# Patient Record
Sex: Female | Born: 1955 | Hispanic: Yes | Marital: Married | Smoking: Never smoker
Health system: Southern US, Community
[De-identification: ages and names within clinical notes are randomized; demographics above are authoritative.]

## PROBLEM LIST (undated history)

## (undated) DIAGNOSIS — M2021 Hallux rigidus, right foot: Secondary | ICD-10-CM

## (undated) DIAGNOSIS — I10 Essential (primary) hypertension: Secondary | ICD-10-CM

## (undated) DIAGNOSIS — N946 Dysmenorrhea, unspecified: Secondary | ICD-10-CM

## (undated) DIAGNOSIS — R05 Cough: Secondary | ICD-10-CM

## (undated) DIAGNOSIS — E785 Hyperlipidemia, unspecified: Secondary | ICD-10-CM

## (undated) DIAGNOSIS — R002 Palpitations: Secondary | ICD-10-CM

## (undated) DIAGNOSIS — I209 Angina pectoris, unspecified: Secondary | ICD-10-CM

## (undated) DIAGNOSIS — M1990 Unspecified osteoarthritis, unspecified site: Secondary | ICD-10-CM

## (undated) DIAGNOSIS — R059 Cough, unspecified: Secondary | ICD-10-CM

## (undated) HISTORY — PX: COLONOSCOPY: SHX174

## (undated) HISTORY — PX: SQUAMOUS CELL CARCINOMA EXCISION: SHX2433

## (undated) HISTORY — PX: APPENDECTOMY: SHX54

## (undated) HISTORY — PX: TONGUE SURGERY: SHX810

## (undated) HISTORY — PX: NECK SURGERY: SHX720

## (undated) HISTORY — DX: Dysmenorrhea, unspecified: N94.6

## (undated) HISTORY — PX: ARTHRODESIS METATARSALPHALANGEAL JOINT (MTPJ): SHX6566

---

## 2006-03-29 ENCOUNTER — Ambulatory Visit: Payer: Self-pay

## 2007-08-05 ENCOUNTER — Ambulatory Visit: Payer: Self-pay

## 2007-11-07 ENCOUNTER — Ambulatory Visit: Payer: Self-pay | Admitting: Otolaryngology

## 2007-11-10 ENCOUNTER — Ambulatory Visit: Payer: Self-pay | Admitting: Otolaryngology

## 2007-11-12 ENCOUNTER — Ambulatory Visit: Payer: Self-pay | Admitting: Otolaryngology

## 2007-11-19 ENCOUNTER — Inpatient Hospital Stay: Payer: Self-pay | Admitting: Otolaryngology

## 2008-03-24 ENCOUNTER — Encounter: Payer: Self-pay | Admitting: Otolaryngology

## 2008-04-05 ENCOUNTER — Encounter: Payer: Self-pay | Admitting: Otolaryngology

## 2008-08-17 ENCOUNTER — Ambulatory Visit: Payer: Self-pay

## 2008-08-19 ENCOUNTER — Ambulatory Visit: Payer: Self-pay

## 2008-08-31 ENCOUNTER — Ambulatory Visit: Payer: Self-pay | Admitting: Gastroenterology

## 2008-12-10 ENCOUNTER — Ambulatory Visit: Payer: Self-pay | Admitting: Otolaryngology

## 2009-02-21 ENCOUNTER — Ambulatory Visit: Payer: Self-pay

## 2009-08-22 ENCOUNTER — Ambulatory Visit: Payer: Self-pay

## 2009-12-28 ENCOUNTER — Ambulatory Visit: Payer: Self-pay

## 2010-03-03 ENCOUNTER — Ambulatory Visit: Payer: Self-pay | Admitting: Otolaryngology

## 2010-03-09 ENCOUNTER — Ambulatory Visit: Payer: Self-pay | Admitting: Otolaryngology

## 2010-07-19 ENCOUNTER — Ambulatory Visit: Payer: Self-pay | Admitting: Podiatry

## 2010-08-24 ENCOUNTER — Ambulatory Visit: Payer: Self-pay

## 2011-09-27 ENCOUNTER — Ambulatory Visit: Payer: Self-pay

## 2012-02-18 ENCOUNTER — Emergency Department: Payer: Self-pay | Admitting: Emergency Medicine

## 2012-02-18 LAB — CBC
HCT: 39.6 % (ref 35.0–47.0)
RBC: 4.72 10*6/uL (ref 3.80–5.20)
RDW: 13.3 % (ref 11.5–14.5)
WBC: 8.4 10*3/uL (ref 3.6–11.0)

## 2012-02-18 LAB — BASIC METABOLIC PANEL
BUN: 9 mg/dL (ref 7–18)
Chloride: 109 mmol/L — ABNORMAL HIGH (ref 98–107)
Co2: 25 mmol/L (ref 21–32)
Creatinine: 0.79 mg/dL (ref 0.60–1.30)
EGFR (Non-African Amer.): >60
Glucose: 146 mg/dL — ABNORMAL HIGH (ref 65–99)
Osmolality: 281 (ref 275–301)

## 2012-02-18 LAB — CK TOTAL AND CKMB (NOT AT ARMC)
CK, Total: 46 U/L (ref 21–215)
CK-MB: <0.5 ng/mL — ABNORMAL LOW (ref 0.5–3.6)

## 2012-02-18 LAB — TROPONIN I: Troponin-I: <0.02 ng/mL

## 2012-06-05 ENCOUNTER — Ambulatory Visit: Payer: Self-pay | Admitting: Internal Medicine

## 2012-06-05 HISTORY — PX: CARDIAC CATHETERIZATION: SHX172

## 2012-11-04 ENCOUNTER — Ambulatory Visit: Payer: Self-pay

## 2014-01-08 ENCOUNTER — Ambulatory Visit: Payer: Self-pay

## 2014-05-02 LAB — HM PAP SMEAR: HM PAP: NEGATIVE

## 2014-07-26 ENCOUNTER — Encounter: Payer: Self-pay | Admitting: Anesthesiology

## 2014-07-26 ENCOUNTER — Encounter: Payer: Self-pay | Admitting: *Deleted

## 2014-07-30 ENCOUNTER — Ambulatory Visit
Admission: RE | Admit: 2014-07-30 | Payer: Federal, State, Local not specified - PPO | Source: Ambulatory Visit | Admitting: Podiatry

## 2014-07-30 HISTORY — DX: Unspecified osteoarthritis, unspecified site: M19.90

## 2014-07-30 HISTORY — DX: Essential (primary) hypertension: I10

## 2014-07-30 SURGERY — FUSION, JOINT, GREAT TOE
Anesthesia: Regional | Laterality: Right

## 2014-08-10 ENCOUNTER — Encounter: Payer: Self-pay | Admitting: Anesthesiology

## 2014-08-10 NOTE — Discharge Instructions (Signed)
[facility] °[facility] ° °POST OPERATIVE INSTRUCTIONS FOR DR. TROXLER AND DR. FOWLER °[facility] ° ° °1. Take your medication as prescribed.  Pain medication should be taken only as needed. ° °2. Keep the dressing clean, dry and intact. ° °3. Keep your foot elevated above the heart level for the first 48 hours. ° °4. Walking to the bathroom and brief periods of walking are acceptable, unless we have instructed you to be non-weight bearing. ° °5. Always wear your post-op shoe when walking.  Always use your crutches if you are to be non-weight bearing. ° °6. Do not take a shower. Baths are permissible as long as the foot is kept out of the water.  ° °7. Every hour you are awake:  °- Bend your knee 15 times. °- Flex foot 15 times °- Massage calf 15 times ° °8. Call [facility] ([phone]) if any of the following problems occur: °- You develop a temperature or fever. °- The bandage becomes saturated with blood. °- Medication does not stop your pain. °- Injury of the foot occurs. °- Any symptoms of infection including redness, odor, or red streaks running from wound. °-  ° °General Anesthesia, Care After °Refer to this sheet in the next few weeks. These instructions provide you with information on caring for yourself after your procedure. Your health care provider may also give you more specific instructions. Your treatment has been planned according to current medical practices, but problems sometimes occur. Call your health care provider if you have any problems or questions after your procedure. °WHAT TO EXPECT AFTER THE PROCEDURE °After the procedure, it is typical to experience: °· Sleepiness. °· Nausea and vomiting. °HOME CARE INSTRUCTIONS °· For the first 24 hours after general anesthesia: °¨ Have a responsible person with you. °¨ Do not drive a car. If you are alone, do not take public transportation. °¨ Do not drink alcohol. °¨ Do not take medicine  that has not been prescribed by your health care provider. °¨ Do not sign important papers or make important decisions. °¨ You may resume a normal diet and activities as directed by your health care provider. °· Change bandages (dressings) as directed. °· If you have questions or problems that seem related to general anesthesia, call the hospital and ask for the anesthetist or anesthesiologist on call. °SEEK MEDICAL CARE IF: °· You have nausea and vomiting that continue the day after anesthesia. °· You develop a rash. °SEEK IMMEDIATE MEDICAL CARE IF:  °· You have difficulty breathing. °· You have chest pain. °· You have any allergic problems. °Document Released: 04/30/2000 Document Revised: 01/27/2013 Document Reviewed: 08/07/2012 °ExitCare® Patient Information ©2015 [employer]. This information is not intended to replace advice given to you by your health care provider. Make sure you discuss any questions you have with your health care provider. ° °

## 2014-08-11 ENCOUNTER — Ambulatory Visit
Admission: RE | Admit: 2014-08-11 | Payer: Federal, State, Local not specified - PPO | Source: Ambulatory Visit | Admitting: Podiatry

## 2014-08-11 HISTORY — DX: Angina pectoris, unspecified: I20.9

## 2014-08-11 HISTORY — DX: Palpitations: R00.2

## 2014-08-11 HISTORY — DX: Hyperlipidemia, unspecified: E78.5

## 2014-08-11 HISTORY — DX: Cough, unspecified: R05.9

## 2014-08-11 HISTORY — DX: Hallux rigidus, right foot: M20.21

## 2014-08-11 HISTORY — DX: Cough: R05

## 2014-08-11 SURGERY — FUSION, JOINT, GREAT TOE
Anesthesia: Regional | Laterality: Right

## 2015-01-19 ENCOUNTER — Ambulatory Visit: Admit: 2015-01-19 | Payer: Federal, State, Local not specified - PPO | Admitting: Podiatry

## 2015-01-19 SURGERY — FUSION, JOINT, GREAT TOE
Anesthesia: Regional | Laterality: Right

## 2015-04-18 ENCOUNTER — Other Ambulatory Visit: Payer: Self-pay | Admitting: Obstetrics and Gynecology

## 2015-04-18 DIAGNOSIS — Z1231 Encounter for screening mammogram for malignant neoplasm of breast: Secondary | ICD-10-CM

## 2015-04-27 ENCOUNTER — Ambulatory Visit
Admission: RE | Admit: 2015-04-27 | Discharge: 2015-04-27 | Disposition: A | Discharge status: Home / Self Care | Payer: Federal, State, Local not specified - PPO | Source: Ambulatory Visit | Attending: Obstetrics and Gynecology | Admitting: Obstetrics and Gynecology

## 2015-04-27 DIAGNOSIS — Z1231 Encounter for screening mammogram for malignant neoplasm of breast: Secondary | ICD-10-CM | POA: Diagnosis not present

## 2015-04-27 LAB — HM MAMMOGRAPHY

## 2015-07-13 ENCOUNTER — Ambulatory Visit: Admit: 2015-07-13 | Payer: Federal, State, Local not specified - PPO | Admitting: Podiatry

## 2015-07-13 SURGERY — FUSION, JOINT, GREAT TOE
Anesthesia: General | Laterality: Right

## 2016-03-12 ENCOUNTER — Other Ambulatory Visit: Payer: Self-pay | Admitting: Family Medicine

## 2016-03-12 ENCOUNTER — Other Ambulatory Visit: Payer: Self-pay | Admitting: Obstetrics and Gynecology

## 2016-03-12 DIAGNOSIS — Z1231 Encounter for screening mammogram for malignant neoplasm of breast: Secondary | ICD-10-CM

## 2016-04-29 NOTE — Progress Notes (Deleted)
    HPI:      [name] is a 61 y.o. No obstetric history on file. who LMP was No LMP recorded. Patient is postmenopausal., presents today for her annual examination.  Her menses are {norm/abn:715}, lasting {number:22536} days.  Dysmenorrhea {dysmen:716}. She {does:18564} have intermenstrual bleeding.  She {does:18564} have vasomotor sx. She uses ***meds.  Sex activity: {sex active:315163}. She {does:18564} have vaginal dryness.  Last Pap: 04/27/14. Results were: no abnormalities /neg HPV DNA.  Hx of STDs: {STD hx:14358}  Last mammogram: April 27, 2015  Results were: normal--routine follow-up in 12 months There is no FH of breast cancer. There is no FH of ovarian cancer. The patient {does:18564} do self-breast exams.  Colonoscopy: colonoscopy 8 years ago without abnormalities.   Tobacco use: {tob:20664} Alcohol use: {Alcohol:11675} Exercise: {exercise:31265}  She {does:18564} get adequate calcium and Vitamin D in her diet. ***  Past Medical History:  Diagnosis Date  . Anginal pain (HCC)    stable  . Arthritis    feet  . Cough    AND CHEST CONGESTION, ON ANTIBIOTICS  . Hallux rigidus of right foot   . Hyperlipidemia   . Hypertension    borderline  . Palpitations     Past Surgical History:  Procedure Laterality Date  . APPENDECTOMY    . ARTHRODESIS METATARSALPHALANGEAL JOINT (MTPJ) Left    first mtp joint  . CARDIAC CATHETERIZATION  06/05/12   [facility] - report in paper chart  . CESAREAN SECTION  1985, 1989  . COLONOSCOPY    . COLONOSCOPY    . NECK SURGERY Right    anterior  . SQUAMOUS CELL CARCINOMA EXCISION Right    glossal  . TONGUE SURGERY Right    glossal squamous cell carcinoma, smoker    No family history on file.   ROS:  ROS  Objective: There were no vitals taken for this visit.   OBGyn Exam  Results: No results found for this or any previous visit (from the past 24 hour(s)).  Assessment/Plan:  *** No orders of the defined types were  placed in this encounter.           GYN counsel {counseling:16159}     F/U  No Follow-up on file.  Diana B. Copland, PA-C 04/29/2016 2:39 PM

## 2016-04-30 ENCOUNTER — Ambulatory Visit: Payer: Self-pay | Admitting: Obstetrics and Gynecology

## 2016-05-04 ENCOUNTER — Ambulatory Visit: Payer: Federal, State, Local not specified - PPO

## 2016-06-13 ENCOUNTER — Ambulatory Visit: Payer: Self-pay | Admitting: Obstetrics and Gynecology

## 2016-07-06 ENCOUNTER — Other Ambulatory Visit: Payer: Self-pay | Admitting: Podiatry

## 2016-07-09 ENCOUNTER — Ambulatory Visit
Admission: RE | Admit: 2016-07-09 | Discharge: 2016-07-09 | Disposition: A | Discharge status: Home / Self Care | Payer: Federal, State, Local not specified - PPO | Source: Ambulatory Visit | Attending: Family Medicine | Admitting: Family Medicine

## 2016-07-09 DIAGNOSIS — Z1231 Encounter for screening mammogram for malignant neoplasm of breast: Secondary | ICD-10-CM | POA: Insufficient documentation

## 2016-07-10 NOTE — Discharge Instructions (Signed)
[facility] °[facility] ° °POST OPERATIVE INSTRUCTIONS FOR DR. TROXLER AND DR. FOWLER °[facility] ° ° °1. Take your medication as prescribed.  Pain medication should be taken only as needed. ° °2. Keep the dressing clean, dry and intact. ° °3. Keep your foot elevated above the heart level for the first 48 hours. ° °4. Walking to the bathroom and brief periods of walking are acceptable, unless we have instructed you to be non-weight bearing. ° °5. Always wear your post-op shoe when walking.  Always use your crutches if you are to be non-weight bearing. ° °6. Do not take a shower. Baths are permissible as long as the foot is kept out of the water.  ° °7. Every hour you are awake:  °- Bend your knee 15 times. °- Flex foot 15 times °- Massage calf 15 times ° °8. Call [facility] ([phone]) if any of the following problems occur: °- You develop a temperature or fever. °- The bandage becomes saturated with blood. °- Medication does not stop your pain. °- Injury of the foot occurs. °- Any symptoms of infection including redness, odor, or red streaks running from wound. ° ° °General Anesthesia, Adult, Care After °These instructions provide you with information about caring for yourself after your procedure. Your health care provider may also give you more specific instructions. Your treatment has been planned according to current medical practices, but problems sometimes occur. Call your health care provider if you have any problems or questions after your procedure. °What can I expect after the procedure? °After the procedure, it is common to have: °· Vomiting. °· A sore throat. °· Mental slowness. ° °It is common to feel: °· Nauseous. °· Cold or shivery. °· Sleepy. °· Tired. °· Sore or achy, even in parts of your body where you did not have surgery. ° °Follow these instructions at home: °For at least 24 hours after the procedure: °· Do not: °? Participate in  activities where you could fall or become injured. °? Drive. °? Use heavy machinery. °? Drink alcohol. °? Take sleeping pills or medicines that cause drowsiness. °? Make important decisions or sign legal documents. °? Take care of children on your own. °· Rest. °Eating and drinking °· If you vomit, drink water, juice, or soup when you can drink without vomiting. °· Drink enough fluid to keep your urine clear or pale yellow. °· Make sure you have little or no nausea before eating solid foods. °· Follow the diet recommended by your health care provider. °General instructions °· Have a responsible adult stay with you until you are awake and alert. °· Return to your normal activities as told by your health care provider. Ask your health care provider what activities are safe for you. °· Take over-the-counter and prescription medicines only as told by your health care provider. °· If you smoke, do not smoke without supervision. °· Keep all follow-up visits as told by your health care provider. This is important. °Contact a health care provider if: °· You continue to have nausea or vomiting at home, and medicines are not helpful. °· You cannot drink fluids or start eating again. °· You cannot urinate after 8-12 hours. °· You develop a skin rash. °· You have fever. °· You have increasing redness at the site of your procedure. °Get help right away if: °· You have difficulty breathing. °· You have chest pain. °· You have unexpected bleeding. °· You feel that you   are having a life-threatening or urgent problem. °This information is not intended to replace advice given to you by your health care provider. Make sure you discuss any questions you have with your health care provider. °Document Released: 04/30/2000 Document Revised: 06/27/2015 Document Reviewed: 01/06/2015 °Elsevier Interactive Patient Education © 2018 [employer]. ° °

## 2016-07-11 ENCOUNTER — Ambulatory Visit: Payer: Federal, State, Local not specified - PPO | Admitting: Anesthesiology

## 2016-07-11 ENCOUNTER — Ambulatory Visit
Admission: RE | Admit: 2016-07-11 | Discharge: 2016-07-11 | Disposition: A | Discharge status: Home / Self Care | Payer: Federal, State, Local not specified - PPO | Source: Ambulatory Visit | Attending: Podiatry | Admitting: Podiatry

## 2016-07-11 ENCOUNTER — Encounter: Payer: Self-pay | Admitting: *Deleted

## 2016-07-11 ENCOUNTER — Encounter
Admission: RE | Disposition: A | Discharge status: Home / Self Care | Payer: Self-pay | Source: Ambulatory Visit | Attending: Podiatry

## 2016-07-11 DIAGNOSIS — Z85810 Personal history of malignant neoplasm of tongue: Secondary | ICD-10-CM | POA: Insufficient documentation

## 2016-07-11 DIAGNOSIS — Z9049 Acquired absence of other specified parts of digestive tract: Secondary | ICD-10-CM | POA: Insufficient documentation

## 2016-07-11 DIAGNOSIS — Z825 Family history of asthma and other chronic lower respiratory diseases: Secondary | ICD-10-CM | POA: Insufficient documentation

## 2016-07-11 DIAGNOSIS — M2021 Hallux rigidus, right foot: Secondary | ICD-10-CM | POA: Insufficient documentation

## 2016-07-11 DIAGNOSIS — Z9889 Other specified postprocedural states: Secondary | ICD-10-CM | POA: Insufficient documentation

## 2016-07-11 DIAGNOSIS — Z7984 Long term (current) use of oral hypoglycemic drugs: Secondary | ICD-10-CM | POA: Insufficient documentation

## 2016-07-11 DIAGNOSIS — Z79899 Other long term (current) drug therapy: Secondary | ICD-10-CM | POA: Diagnosis not present

## 2016-07-11 DIAGNOSIS — E782 Mixed hyperlipidemia: Secondary | ICD-10-CM | POA: Insufficient documentation

## 2016-07-11 DIAGNOSIS — Z833 Family history of diabetes mellitus: Secondary | ICD-10-CM | POA: Diagnosis not present

## 2016-07-11 DIAGNOSIS — Z8249 Family history of ischemic heart disease and other diseases of the circulatory system: Secondary | ICD-10-CM | POA: Diagnosis not present

## 2016-07-11 DIAGNOSIS — Z7951 Long term (current) use of inhaled steroids: Secondary | ICD-10-CM | POA: Insufficient documentation

## 2016-07-11 DIAGNOSIS — I10 Essential (primary) hypertension: Secondary | ICD-10-CM | POA: Insufficient documentation

## 2016-07-11 DIAGNOSIS — R002 Palpitations: Secondary | ICD-10-CM | POA: Diagnosis not present

## 2016-07-11 DIAGNOSIS — I493 Ventricular premature depolarization: Secondary | ICD-10-CM | POA: Insufficient documentation

## 2016-07-11 HISTORY — PX: FOOT ARTHRODESIS: SHX1655

## 2016-07-11 SURGERY — FUSION, JOINT, FOOT
Anesthesia: Regional | Site: Foot | Laterality: Right | Wound class: Clean

## 2016-07-11 MED ORDER — BUPIVACAINE HCL (PF) 0.25 % IJ SOLN
INTRAMUSCULAR | Status: DC | PRN
  Administered 2016-07-11: 10 mL

## 2016-07-11 MED ORDER — HYDROCODONE-ACETAMINOPHEN 5-325 MG PO TABS: 1.0000 | ORAL_TABLET | ORAL | 0 refills | Status: DC | PRN

## 2016-07-11 MED ORDER — CEFAZOLIN SODIUM-DEXTROSE 2-4 GM/100ML-% IV SOLN
2.0000 g | INTRAVENOUS | Status: AC
  Administered 2016-07-11: 2 g via INTRAVENOUS

## 2016-07-11 MED ORDER — PROMETHAZINE HCL 25 MG/ML IJ SOLN: 6.2500 mg | INTRAMUSCULAR | Status: DC | PRN

## 2016-07-11 MED ORDER — FENTANYL CITRATE (PF) 100 MCG/2ML IJ SOLN
INTRAMUSCULAR | Status: DC | PRN
  Administered 2016-07-11: 100 ug via INTRAVENOUS

## 2016-07-11 MED ORDER — OXYCODONE HCL 5 MG/5ML PO SOLN: 5.0000 mg | Freq: Once | ORAL | Status: DC | PRN

## 2016-07-11 MED ORDER — FENTANYL CITRATE (PF) 100 MCG/2ML IJ SOLN: 25.0000 ug | INTRAMUSCULAR | Status: DC | PRN

## 2016-07-11 MED ORDER — ONDANSETRON HCL 4 MG/2ML IJ SOLN: 4.0000 mg | Freq: Four times a day (QID) | INTRAMUSCULAR | Status: DC | PRN

## 2016-07-11 MED ORDER — POVIDONE-IODINE 7.5 % EX SOLN
Freq: Once | CUTANEOUS | Status: AC
  Administered 2016-07-11: 12:00:00 via TOPICAL

## 2016-07-11 MED ORDER — ACETAMINOPHEN 325 MG PO TABS
325.0000 mg | ORAL_TABLET | ORAL | Status: DC | PRN
  Administered 2016-07-11: 650 mg via ORAL

## 2016-07-11 MED ORDER — HYDROCODONE-ACETAMINOPHEN 5-325 MG PO TABS: 1.0000 | ORAL_TABLET | ORAL | Status: DC | PRN

## 2016-07-11 MED ORDER — ONDANSETRON HCL 4 MG PO TABS: 4.0000 mg | ORAL_TABLET | Freq: Four times a day (QID) | ORAL | Status: DC | PRN

## 2016-07-11 MED ORDER — LIDOCAINE HCL (CARDIAC) 20 MG/ML IV SOLN
INTRAVENOUS | Status: DC | PRN
  Administered 2016-07-11: 40 mg via INTRATRACHEAL

## 2016-07-11 MED ORDER — ACETAMINOPHEN 160 MG/5ML PO SOLN: 325.0000 mg | ORAL | Status: DC | PRN

## 2016-07-11 MED ORDER — PROPOFOL 10 MG/ML IV BOLUS
INTRAVENOUS | Status: DC | PRN
  Administered 2016-07-11: 50 mg via INTRAVENOUS
  Administered 2016-07-11: 150 mg via INTRAVENOUS

## 2016-07-11 MED ORDER — ROPIVACAINE HCL 5 MG/ML IJ SOLN
INTRAMUSCULAR | Status: DC | PRN
  Administered 2016-07-11: 30 mL via PERINEURAL

## 2016-07-11 MED ORDER — LACTATED RINGERS IV SOLN
10.0000 mL/h | INTRAVENOUS | Status: DC
  Administered 2016-07-11: 10 mL/h via INTRAVENOUS
  Administered 2016-07-11: 14:00:00 via INTRAVENOUS

## 2016-07-11 MED ORDER — ONDANSETRON HCL 4 MG/2ML IJ SOLN
INTRAMUSCULAR | Status: DC | PRN
  Administered 2016-07-11: 4 mg via INTRAVENOUS

## 2016-07-11 MED ORDER — MIDAZOLAM HCL 2 MG/2ML IJ SOLN
INTRAMUSCULAR | Status: DC | PRN
  Administered 2016-07-11: 2 mg via INTRAVENOUS

## 2016-07-11 MED ORDER — GLYCOPYRROLATE 0.2 MG/ML IJ SOLN
INTRAMUSCULAR | Status: DC | PRN
  Administered 2016-07-11: 0.1 mg via INTRAVENOUS

## 2016-07-11 MED ORDER — PROMETHAZINE HCL 12.5 MG PO TABS: 12.5000 mg | ORAL_TABLET | Freq: Four times a day (QID) | ORAL | 0 refills | Status: DC | PRN

## 2016-07-11 MED ORDER — OXYCODONE HCL 5 MG PO TABS: 5.0000 mg | ORAL_TABLET | Freq: Once | ORAL | Status: DC | PRN

## 2016-07-11 MED ORDER — DEXAMETHASONE SODIUM PHOSPHATE 4 MG/ML IJ SOLN
INTRAMUSCULAR | Status: DC | PRN
  Administered 2016-07-11: 4 mg via INTRAVENOUS

## 2016-07-11 SURGICAL SUPPLY — 63 items
BANDAGE ELASTIC 4 VELCRO NS (GAUZE/BANDAGES/DRESSINGS) ×2 IMPLANT
BENZOIN TINCTURE PRP APPL 2/3 (GAUZE/BANDAGES/DRESSINGS) ×2 IMPLANT
BIT DRILL 2 FENESTRATED (MISCELLANEOUS) ×1 IMPLANT
BIT DRILL CANNULTD 2.6 X 130MM (MISCELLANEOUS) ×1 IMPLANT
BIT DRILL SOLID 2.0 X 110MM (DRILL) ×1 IMPLANT
BIT DRILLL 2 FENESTRATED (MISCELLANEOUS) ×1
BLADE MED AGGRESSIVE (BLADE) ×2 IMPLANT
BLADE OSC/SAGITTAL MD 5.5X18 (BLADE) IMPLANT
BLADE SURG 15 STRL LF DISP TIS (BLADE) ×1 IMPLANT
BLADE SURG 15 STRL SS (BLADE) ×1
BNDG COHESIVE 4X5 TAN STRL (GAUZE/BANDAGES/DRESSINGS) ×2 IMPLANT
BNDG ESMARK 4X12 TAN STRL LF (GAUZE/BANDAGES/DRESSINGS) ×2 IMPLANT
BNDG GAUZE 4.5X4.1 6PLY STRL (MISCELLANEOUS) ×2 IMPLANT
BNDG STRETCH 4X75 STRL LF (GAUZE/BANDAGES/DRESSINGS) ×2 IMPLANT
CANISTER SUCT 1200ML W/VALVE (MISCELLANEOUS) ×2 IMPLANT
COUNTERSICK 4.0 HEADED (MISCELLANEOUS) ×2
COVER LIGHT HANDLE UNIVERSAL (MISCELLANEOUS) ×4 IMPLANT
CUFF TOURN SGL QUICK 18 (TOURNIQUET CUFF) ×2 IMPLANT
DRAPE FLUOR MINI C-ARM 54X84 (DRAPES) ×2 IMPLANT
DRILL CANNULATED 2.6 X 130MM (MISCELLANEOUS) ×2
DRILL SOLID 2.0 X 110MM (DRILL) ×2
DURAPREP 26ML APPLICATOR (WOUND CARE) ×2 IMPLANT
FEMALE REAMER 23MM ×2 IMPLANT
GAUZE PETRO XEROFOAM 1X8 (MISCELLANEOUS) ×2 IMPLANT
GAUZE SPONGE 4X4 12PLY STRL (GAUZE/BANDAGES/DRESSINGS) ×2 IMPLANT
GLOVE BIO SURGEON STRL SZ7.5 (GLOVE) ×4 IMPLANT
GLOVE INDICATOR 8.0 STRL GRN (GLOVE) ×4 IMPLANT
GOWN STRL REUS W/ TWL LRG LVL3 (GOWN DISPOSABLE) ×2 IMPLANT
GOWN STRL REUS W/TWL LRG LVL3 (GOWN DISPOSABLE) ×2
K-WIRE DBL END TROCAR 6X.045 (WIRE)
K-WIRE DBL END TROCAR 6X.062 (WIRE)
K-WIRE SMOOTH 1.6X150MM (WIRE) ×2
K-WIRE SNGL END 1.2X150 (MISCELLANEOUS) ×2
KIT ROOM TURNOVER OR (KITS) ×2 IMPLANT
KWIRE DBL END TROCAR 6X.045 (WIRE) IMPLANT
KWIRE DBL END TROCAR 6X.062 (WIRE) IMPLANT
KWIRE SMOOTH 1.6X150MM (WIRE) ×1 IMPLANT
KWIRE SNGL END 1.2X150 (MISCELLANEOUS) ×1 IMPLANT
MALE REAMER 23 MM ×2 IMPLANT
NON-LOCKING PLATE SCREW 3.5 X 16MM (Screw) ×2 IMPLANT
NS IRRIG 500ML POUR BTL (IV SOLUTION) ×2 IMPLANT
PACK EXTREMITY ARMC (MISCELLANEOUS) ×2 IMPLANT
PAD GROUND ADULT SPLIT (MISCELLANEOUS) ×2 IMPLANT
PIN BALLS 3/8 F/.045 WIRE (MISCELLANEOUS) ×2 IMPLANT
PLATE MTP 0DEG RIGHT (Plate) ×2 IMPLANT
RASP SM TEAR CROSS CUT (RASP) ×2 IMPLANT
SCREW 4.0X24 ST (Screw) ×2 IMPLANT
SCREW COUNTERSINK 4.0 HEADED (MISCELLANEOUS) ×1 IMPLANT
SCREW LOCK PLATE R3 2.7X12 (Screw) ×2 IMPLANT
SCREW LOCK PLATE R3 2.7X18 (Screw) ×6 IMPLANT
SCREW LOCK PLATE R3 2.7X22 (Screw) ×2 IMPLANT
SCREW NON LOCKING PLATE 2.7X14 (Screw) ×2 IMPLANT
STOCKINETTE IMPERVIOUS LG (DRAPES) ×2 IMPLANT
STRAP BODY AND KNEE 60X3 (MISCELLANEOUS) ×2 IMPLANT
STRIP CLOSURE SKIN 1/4X4 (GAUZE/BANDAGES/DRESSINGS) ×2 IMPLANT
SUT MNCRL 5-0+ PC-1 (SUTURE) ×1 IMPLANT
SUT MONOCRYL 5-0 (SUTURE) ×1
SUT VIC AB 2-0 SH 27 (SUTURE)
SUT VIC AB 2-0 SH 27XBRD (SUTURE) IMPLANT
SUT VIC AB 3-0 SH 27 (SUTURE) ×1
SUT VIC AB 3-0 SH 27X BRD (SUTURE) ×1 IMPLANT
SUT VIC AB 4-0 FS2 27 (SUTURE) ×2 IMPLANT
WIRE OLIVE SMOOTH 1.4MMX60MM (WIRE) ×4 IMPLANT

## 2016-07-11 NOTE — H&P (Signed)
HISTORY AND PHYSICAL INTERVAL NOTE:  07/11/2016  12:30 PM  Diana Booth  has presented today for surgery, with the diagnosis of Hallux regidus of right foot  M20.21.  The various methods of treatment have been discussed with the patient.  No guarantees were given.  After consideration of risks, benefits and other options for treatment, the patient has consented to surgery.  I have reviewed the patients' chart and labs.    Patient Vitals for the past 24 hrs:  BP Temp Pulse Resp SpO2 Height Weight  07/11/16 1209 - - (!) 52 10 100 % - -  07/11/16 1208 - - 62 14 100 % - -  07/11/16 1207 - - (!) 53 12 100 % - -  07/11/16 1206 - - (!) 56 18 100 % - -  07/11/16 1205 (!) 159/72 - (!) 51 15 100 % - -  07/11/16 1204 - - (!) 57 13 100 % - -  07/11/16 1203 - - 60 18 100 % - -  07/11/16 1202 - - (!) 52 14 100 % - -  07/11/16 1201 (!) 147/65 - (!) 53 16 100 % - -  07/11/16 1200 - - (!) 59 15 100 % - -  07/11/16 1159 - - (!) 59 16 100 % - -  07/11/16 1156 (!) 148/69 - - - - - -  07/11/16 1153 - - (!) 57 17 100 % - -  07/11/16 1152 - - (!) 57 19 100 % - -  07/11/16 1151 - - 61 18 100 % - -  07/11/16 1135 (!) 166/81 - (!) 56 12 100 % - -  07/11/16 1130 (!) 162/75 - 67 19 100 % - -  07/11/16 1107 (!) 158/78 98.1 F (36.7 C) 68 16 100 % 5\' 4"  (1.626 m) 81.2 kg (179 lb)    A history and physical examination was performed in my office.  The patient was reexamined.  There have been no changes to this history and physical examination.  Gwyneth RevelsFowler, Nora Sabey A

## 2016-07-11 NOTE — Op Note (Signed)
Operative note   Surgeon:Rapheal Masso Armed forces logistics/support/administrative officerowler    Assistant: None    Preop diagnosis: Right first MTPJ hallux rigidus    Postop diagnosis: Same    Procedure: Right first MTPJ arthrodesis    EBL: Minimal    Anesthesia:regional and general    Hemostasis: Midcalf tourniquet inflated to 200 mmHg for 94 minutes    Specimen: None    Complications: None    Operative indications:Diana Gwenlyn PerkingMadera is an 61 y.o. that presents today for surgical intervention.  The risks/benefits/alternatives/complications have been discussed and consent has been given.    Procedure:  Patient was brought into the OR and placed on the operating table in thesupine position. After anesthesia was obtained theright lower extremity was prepped and draped in usual sterile fashion.  Attention was directed to the dorsal medial first MTPJ where a longitudinal incision was performed. Sharp and blunt dissection carried down to the capsule. Longitudinal capsulotomy was then performed. The head of the metatarsal and base of the proximal phalanx were exposed. All articular cartilage and periarticular spurring was removed at this time. Next using a cup and cone reamer the head of the metatarsal and base of the proximal phalanx were then prepared for arthrodesis. Good removal of the bone through the subchondral bone plate was noted. Areas were then drilled with a 2 mm punch drill bit. The toe was held in a neutral position approximately 5 from the plantar surface. A 4.0 mm compression screw was driven from distal medial to proximal lateral. A dorsal medium sized locking plate with a 0 bend from the paragon28 set was then fashioned dorsally. Good alignment and compression was noted with good stability. All wounds were flushed with copious amounts of irrigation. Closure was performed with a 3-0 Vicryl for the subcutaneous and capsular tissue. Vicryl for the subcutaneous tissue and a 5-0 Monocryl undyed for skin. 0.25% Marcaine was introduced  along all areas.    Patient tolerated the procedure and anesthesia well.  Was transported from the OR to the PACU with all vital signs stable and vascular status intact. To be discharged per routine protocol.  Will follow up in approximately 1 week in the outpatient clinic.

## 2016-07-11 NOTE — Transfer of Care (Signed)
Immediate Anesthesia Transfer of Care Note  Patient: Lorrin MaisMiriam Berne  Procedure(s) Performed: Procedure(s): ARTHRODESIS FOOT-1ST MTPJ FUSION-RIGHT (Right)  Patient Location: PACU  Anesthesia Type: Regional, General  Level of Consciousness: awake, alert  and patient cooperative  Airway and Oxygen Therapy: Patient Spontanous Breathing and Patient connected to supplemental oxygen  Post-op Assessment: Post-op Vital signs reviewed, Patient's Cardiovascular Status Stable, Respiratory Function Stable, Patent Airway and No signs of Nausea or vomiting  Post-op Vital Signs: Reviewed and stable  Complications: No apparent anesthesia complications

## 2016-07-11 NOTE — Anesthesia Procedure Notes (Signed)
Procedure Name: LMA Insertion Date/Time: 07/11/2016 12:48 PM Performed by: Jimmy PicketAMYOT, Sherolyn Trettin Pre-anesthesia Checklist: Patient identified, Emergency Drugs available, Suction available, Timeout performed and Patient being monitored Patient Re-evaluated:Patient Re-evaluated prior to inductionOxygen Delivery Method: Circle system utilized Preoxygenation: Pre-oxygenation with 100% oxygen Intubation Type: IV induction LMA: LMA inserted LMA Size: 4.0 Number of attempts: 1 Placement Confirmation: positive ETCO2 and breath sounds checked- equal and bilateral Tube secured with: Tape

## 2016-07-11 NOTE — Anesthesia Preprocedure Evaluation (Signed)
Anesthesia Evaluation  Patient identified by MRN, date of birth, ID band Patient awake    Reviewed: Allergy & Precautions, NPO status , Patient's Chart, lab work & pertinent test results  Airway Mallampati: II  TM Distance: >3 FB     Dental  (+) Teeth Intact   Pulmonary    breath sounds clear to auscultation       Cardiovascular hypertension (borderline, no medications),  Rhythm:Regular Rate:Normal  hyperlipidemia   Neuro/Psych    GI/Hepatic   Endo/Other    Renal/GU      Musculoskeletal  (+) Arthritis ,   Abdominal   Peds  Hematology   Anesthesia Other Findings   Reproductive/Obstetrics                            Anesthesia Physical Anesthesia Plan  ASA: II  Anesthesia Plan: Regional and General   Post-op Pain Management:  Regional for Post-op pain   Induction:   PONV Risk Score and Plan: 3 and Ondansetron, Dexamethasone and Treatment may vary due to age  Airway Management Planned:   Additional Equipment:   Intra-op Plan:   Post-operative Plan:   Informed Consent: I have reviewed the patients History and Physical, chart, labs and discussed the procedure including the risks, benefits and alternatives for the proposed anesthesia with the patient or authorized representative who has indicated his/her understanding and acceptance.   Dental advisory given  Plan Discussed with: CRNA  Anesthesia Plan Comments:         Anesthesia Quick Evaluation

## 2016-07-11 NOTE — Progress Notes (Signed)
Assisted Dr.Elsje with right, ultrasound guided, popliteal block. Side rails up, monitors on throughout procedure. See vital signs in flow sheet. Tolerated Procedure well.

## 2016-07-11 NOTE — Anesthesia Procedure Notes (Signed)
Anesthesia Regional Block: Popliteal block   Pre-Anesthetic Checklist: ,, timeout performed, Correct Patient, Correct Site, Correct Laterality, Correct Procedure, Correct Position, risks and benefits discussed, surgical consent, pre-op evaluation,  At surgeon's request and post-op pain management  Laterality: Right  Prep: chloraprep       Needles:  Injection technique: Single-shot  Needle Type: Stimiplex     Needle Length: 10cm  Needle Gauge: 21     Additional Needles:   Procedures: ultrasound guided,,,,,,,,  Narrative:  Start time: 07/11/2016 11:32 AM End time: 07/11/2016 11:38 AM Injection made incrementally with aspirations every 5 mL.  Performed by: Personally  Anesthesiologist: Jola BabinskiHARKER, Kerryn Tennant  Additional Notes: No immediate complications.

## 2016-07-11 NOTE — Anesthesia Postprocedure Evaluation (Signed)
Anesthesia Post Note  Patient: Diana Booth  Procedure(s) Performed: Procedure(s) (LRB): ARTHRODESIS FOOT-1ST MTPJ FUSION-RIGHT (Right)  Patient location during evaluation: PACU Anesthesia Type: Regional and General Level of consciousness: awake and alert Pain management: pain level controlled Vital Signs Assessment: post-procedure vital signs reviewed and stable Respiratory status: spontaneous breathing, nonlabored ventilation and respiratory function stable Cardiovascular status: stable Postop Assessment: no signs of nausea or vomiting Anesthetic complications: no    Jola BabinskiElsje Estle Sabella

## 2016-07-12 ENCOUNTER — Encounter: Payer: Self-pay | Admitting: Podiatry

## 2016-07-18 ENCOUNTER — Ambulatory Visit: Payer: Self-pay | Admitting: Obstetrics and Gynecology

## 2016-12-17 ENCOUNTER — Ambulatory Visit: Payer: Self-pay | Admitting: Obstetrics and Gynecology

## 2016-12-26 ENCOUNTER — Ambulatory Visit (INDEPENDENT_AMBULATORY_CARE_PROVIDER_SITE_OTHER): Payer: Federal, State, Local not specified - PPO | Admitting: Obstetrics and Gynecology

## 2016-12-26 ENCOUNTER — Encounter: Payer: Self-pay | Admitting: Obstetrics and Gynecology

## 2016-12-26 VITALS — BP 138/80 | HR 68 | Ht 64.0 in | Wt 183.0 lb

## 2016-12-26 DIAGNOSIS — Z1231 Encounter for screening mammogram for malignant neoplasm of breast: Secondary | ICD-10-CM | POA: Diagnosis not present

## 2016-12-26 DIAGNOSIS — Z1239 Encounter for other screening for malignant neoplasm of breast: Secondary | ICD-10-CM

## 2016-12-26 DIAGNOSIS — Z01419 Encounter for gynecological examination (general) (routine) without abnormal findings: Secondary | ICD-10-CM | POA: Diagnosis not present

## 2016-12-26 NOTE — Progress Notes (Signed)
PCP: System, Pcp Not In   Chief Complaint  Patient presents with  . Gynecologic Exam    HPI:      [name] is a 61 y.o. [id number] who LMP was No LMP recorded. Patient is postmenopausal., presents today for her annual examination.  Her menses are absent due to menopause.  She does not have intermenstrual bleeding.  She does have tolerable vasomotor sx.  Sex activity: single partner, contraception - post menopausal status. She does have vaginal dryness. Has not tried lubricants.  Last Pap: April 27, 2014  Results were: no abnormalities /neg HPV DNA.  Hx of STDs: none  Last mammogram: July 09, 2016  Results were: normal--routine follow-up in 12 months There is a FH of breast cancer in her pat cousin, genetic testing not indicated for pt. There is no FH of ovarian cancer. The patient does do self-breast exams.  Colonoscopy: colonoscopy 8 years ago without abnormalities. Repeat due after 10 years.   Tobacco use: The patient denies current or previous tobacco use. Alcohol use: none Exercise: moderately active  She does get adequate calcium and Vitamin D in her diet.  Labs with PCP.   Past Medical History:  Diagnosis Date  . Anginal pain (HCC)    stable  . Arthritis    feet  . Cough    AND CHEST CONGESTION, ON ANTIBIOTICS  . Dysmenorrhea   . Hallux rigidus of right foot   . Hyperlipidemia   . Hypertension    borderline  . Palpitations     Past Surgical History:  Procedure Laterality Date  . APPENDECTOMY    . ARTHRODESIS METATARSALPHALANGEAL JOINT (MTPJ) Left    first mtp joint  . CARDIAC CATHETERIZATION  06/05/12   [facility] - report in paper chart  . CESAREAN SECTION  1985, 1989  . COLONOSCOPY    . COLONOSCOPY    . FOOT ARTHRODESIS Right 07/11/2016   Procedure: ARTHRODESIS FOOT-1ST MTPJ FUSION-RIGHT;  Surgeon: Gwyneth RevelsFowler, Justin, DPM;  [facility];  Service: Podiatry;  Laterality: Right;  . NECK SURGERY Right    anterior  . SQUAMOUS CELL CARCINOMA  EXCISION Right    glossal  . TONGUE SURGERY Right    glossal squamous cell carcinoma, smoker    Family History  Problem Relation Age of Onset  . Breast cancer Neg Hx     Social History   Socioeconomic History  . Marital status: Married    Spouse name: Not on file  . Number of children: Not on file  . Years of education: Not on file  . Highest education level: Not on file  Social Needs  . Financial resource strain: Not on file  . Food insecurity - worry: Not on file  . Food insecurity - inability: Not on file  . Transportation needs - medical: Not on file  . Transportation needs - non-medical: Not on file  Occupational History  . Not on file  Tobacco Use  . Smoking status: Never Smoker  . Smokeless tobacco: Never Used  Substance and Sexual Activity  . Alcohol use: Yes    Comment: "glass of wine every couple of weeks"  . Drug use: No  . Sexual activity: Not on file  Other Topics Concern  . Not on file  Social History Narrative  . Not on file    No outpatient medications have been marked as taking for the 12/26/16 encounter (Office Visit) with Copland, Ilona SorrelAlicia B, PA-C.      ROS:  Review  of Systems  Constitutional: Negative for fatigue, fever and unexpected weight change.  Respiratory: Negative for cough, shortness of breath and wheezing.   Cardiovascular: Negative for chest pain, palpitations and leg swelling.  Gastrointestinal: Negative for blood in stool, constipation, diarrhea, nausea and vomiting.  Endocrine: Negative for cold intolerance, heat intolerance and polyuria.  Genitourinary: Negative for dyspareunia, dysuria, flank pain, frequency, genital sores, hematuria, menstrual problem, pelvic pain, urgency, vaginal bleeding, vaginal discharge and vaginal pain.  Musculoskeletal: Negative for back pain, joint swelling and myalgias.  Skin: Negative for rash.  Neurological: Negative for dizziness, syncope, light-headedness, numbness and headaches.    Hematological: Negative for adenopathy.  Psychiatric/Behavioral: Negative for agitation, confusion, sleep disturbance and suicidal ideas. The patient is not nervous/anxious.      Objective: BP 138/80   Pulse 68   Ht 5\' 4"  (1.626 m)   Wt 183 lb (83 kg)   BMI 31.41 kg/m    Physical Exam  Constitutional: She is oriented to person, place, and time. She appears well-developed and well-nourished.  Genitourinary: Vagina normal and uterus normal. There is no rash or tenderness on the right labia. There is no rash or tenderness on the left labia. No erythema or tenderness in the vagina. No vaginal discharge found. Right adnexum does not display mass and does not display tenderness. Left adnexum does not display mass and does not display tenderness. Cervix does not exhibit motion tenderness or polyp. Uterus is not enlarged or tender.  Neck: Normal range of motion. No thyromegaly present.  Cardiovascular: Normal rate, regular rhythm and normal heart sounds.  No murmur heard. Pulmonary/Chest: Effort normal and breath sounds normal. Right breast exhibits no mass, no nipple discharge, no skin change and no tenderness. Left breast exhibits no mass, no nipple discharge, no skin change and no tenderness.  Abdominal: Soft. There is no tenderness. There is no guarding.  Musculoskeletal: Normal range of motion.  Neurological: She is alert and oriented to person, place, and time. No cranial nerve deficit.  Psychiatric: She has a normal mood and affect. Her behavior is normal.  Vitals reviewed.   Assessment/Plan:  Encounter for annual routine gynecological examination  Screening for breast cancer - Mammo due 6/19. - Plan: MM DIGITAL SCREENING BILATERAL        GYN counsel mammography screening, menopause, adequate intake of calcium and vitamin D, diet and exercise    F/U  Return in about 1 year (around 12/26/2017).  Alicia B. Copland, PA-C 12/26/2016 8:57 AM

## 2016-12-26 NOTE — Patient Instructions (Signed)
I value your feedback and entrusting us with your care. If you get a [facility] patient survey, I would appreciate you taking the time to let us know about your experience today. Thank you! 

## 2017-07-16 ENCOUNTER — Encounter: Payer: Self-pay | Admitting: Obstetrics and Gynecology

## 2017-07-16 ENCOUNTER — Ambulatory Visit
Admission: RE | Admit: 2017-07-16 | Discharge: 2017-07-16 | Disposition: A | Discharge status: Home / Self Care | Payer: Federal, State, Local not specified - PPO | Source: Ambulatory Visit | Attending: Obstetrics and Gynecology | Admitting: Obstetrics and Gynecology

## 2017-07-16 DIAGNOSIS — Z1231 Encounter for screening mammogram for malignant neoplasm of breast: Secondary | ICD-10-CM | POA: Diagnosis present

## 2017-07-16 DIAGNOSIS — Z1239 Encounter for other screening for malignant neoplasm of breast: Secondary | ICD-10-CM

## 2018-01-20 ENCOUNTER — Ambulatory Visit (INDEPENDENT_AMBULATORY_CARE_PROVIDER_SITE_OTHER): Payer: Federal, State, Local not specified - PPO | Admitting: Obstetrics and Gynecology

## 2018-01-20 ENCOUNTER — Encounter: Payer: Self-pay | Admitting: Obstetrics and Gynecology

## 2018-01-20 ENCOUNTER — Other Ambulatory Visit (HOSPITAL_COMMUNITY)
Admission: RE | Admit: 2018-01-20 | Discharge: 2018-01-20 | Disposition: A | Discharge status: Home / Self Care | Payer: Federal, State, Local not specified - PPO | Source: Ambulatory Visit | Attending: Obstetrics and Gynecology | Admitting: Obstetrics and Gynecology

## 2018-01-20 VITALS — BP 144/86 | HR 74 | Ht 64.0 in | Wt 181.0 lb

## 2018-01-20 DIAGNOSIS — N898 Other specified noninflammatory disorders of vagina: Secondary | ICD-10-CM | POA: Diagnosis not present

## 2018-01-20 DIAGNOSIS — Z1151 Encounter for screening for human papillomavirus (HPV): Secondary | ICD-10-CM | POA: Diagnosis present

## 2018-01-20 DIAGNOSIS — Z124 Encounter for screening for malignant neoplasm of cervix: Secondary | ICD-10-CM

## 2018-01-20 DIAGNOSIS — Z01419 Encounter for gynecological examination (general) (routine) without abnormal findings: Secondary | ICD-10-CM

## 2018-01-20 DIAGNOSIS — Z1239 Encounter for other screening for malignant neoplasm of breast: Secondary | ICD-10-CM

## 2018-01-20 LAB — POCT WET PREP WITH KOH
CLUE CELLS WET PREP PER HPF POC: NEGATIVE
KOH Prep POC: NEGATIVE
Trichomonas, UA: NEGATIVE
YEAST WET PREP PER HPF POC: NEGATIVE

## 2018-01-20 MED ORDER — CLOTRIMAZOLE-BETAMETHASONE 1-0.05 % EX CREA: TOPICAL_CREAM | CUTANEOUS | 0 refills | Status: DC

## 2018-01-20 NOTE — Progress Notes (Signed)
PCP: Jerl Mina, MD   Chief Complaint  Patient presents with  . Gynecologic Exam    pt is having vaginal itchiness and little odor, no discharge, just finished antibiotics about 3 weeks ago    HPI:      [name] is a 62 y.o. [id number] who LMP was No LMP recorded. Patient is postmenopausal., presents today for her annual examination.  Her menses are absent due to menopause.  She does not have intermenstrual bleeding. She does have tolerable vasomotor sx.   Sex activity: single partner, contraception - post menopausal status. She does have vaginal dryness.  She complains of vaginal irritation, no d/c/fishy odor for the past few wks. Sx started after abx use for sinusitis. Pt treated with monistat-1 twice with min relief. Last treated 2 wks ago. Uses dove soap and dryer sheets.   Last Pap: April 27, 2014  Results were: no abnormalities /neg HPV DNA.  Hx of STDs: none  Last mammogram: 07/16/17 Results were: normal--routine follow-up in 12 months There is a FH of breast cancer in her pat cousin, genetic testing not indicated for pt. There is no FH of ovarian cancer. The patient does do self-breast exams.  Colonoscopy: colonoscopy 9 years ago without abnormalities. Repeat due after 10 years.   Tobacco use: The patient denies current or previous tobacco use. Alcohol use: none Exercise: moderately active  She does get adequate calcium and Vitamin D in her diet.  Labs with PCP.   Past Medical History:  Diagnosis Date  . Anginal pain (HCC)    stable  . Arthritis    feet  . Cough    AND CHEST CONGESTION, ON ANTIBIOTICS  . Dysmenorrhea   . Hallux rigidus of right foot   . Hyperlipidemia   . Hypertension    borderline  . Palpitations     Past Surgical History:  Procedure Laterality Date  . APPENDECTOMY    . ARTHRODESIS METATARSALPHALANGEAL JOINT (MTPJ) Left    first mtp joint  . CARDIAC CATHETERIZATION  06/05/12   [facility] - report in paper chart  . CESAREAN SECTION   1985, 1989  . COLONOSCOPY    . COLONOSCOPY    . FOOT ARTHRODESIS Right 07/11/2016   Procedure: ARTHRODESIS FOOT-1ST MTPJ FUSION-RIGHT;  Surgeon: Gwyneth Revels, DPM;  Location: [facility];  Service: Podiatry;  Laterality: Right;  . NECK SURGERY Right    anterior  . SQUAMOUS CELL CARCINOMA EXCISION Right    glossal  . TONGUE SURGERY Right    glossal squamous cell carcinoma, smoker    Family History  Problem Relation Age of Onset  . Hypertension Mother   . Hypertension Father   . Diabetes Sister 12       Type 1  . Hypertension Sister   . Diabetes Brother        Type 2  . Diabetes Sister        Type 2  . Breast cancer Neg Hx     Social History   Socioeconomic History  . Marital status: Married    Spouse name: Not on file  . Number of children: Not on file  . Years of education: Not on file  . Highest education level: Not on file  Occupational History  . Not on file  Social Needs  . Financial resource strain: Not on file  . Food insecurity:    Worry: Not on file    Inability: Not on file  . Transportation needs:  Medical: Not on file    Non-medical: Not on file  Tobacco Use  . Smoking status: Never Smoker  . Smokeless tobacco: Never Used  Substance and Sexual Activity  . Alcohol use: Yes    Comment: "glass of wine every couple of weeks"  . Drug use: No  . Sexual activity: Yes    Birth control/protection: Post-menopausal  Lifestyle  . Physical activity:    Days per week: Not on file    Minutes per session: Not on file  . Stress: Not on file  Relationships  . Social connections:    Talks on phone: Not on file    Gets together: Not on file    Attends religious service: Not on file    Active member of club or organization: Not on file    Attends meetings of clubs or organizations: Not on file    Relationship status: Not on file  . Intimate partner violence:    Fear of current or ex partner: Not on file    Emotionally abused: Not on file     Physically abused: Not on file    Forced sexual activity: Not on file  Other Topics Concern  . Not on file  Social History Narrative  . Not on file    Current Meds  Medication Sig  . benzonatate (TESSALON) 100 MG capsule Take by mouth 3 (three) times daily as needed for cough (as needed for coughing).  . Calcium Carbonate 1500 (600 CA) MG TABS Take by mouth daily.  . Cholecalciferol ([employer] VIT D 5000 HIGH-POTENCY PO) Take by mouth daily.  Marland Kitchen glipiZIDE (GLUCOTROL XL) 2.5 MG 24 hr tablet TAKE ONE TABLET BY MOUTH EVERY DAY  . glucose blood (ACCU-CHEK COMPACT PLUS) test strip Use 2 (two) times daily  . ipratropium (ATROVENT) 0.03 % nasal spray Place into the nose.  Marland Kitchen Specialty Vitamins Products (MENOPAUSE SUPPORT PO) Take by mouth daily.      ROS:  Review of Systems  Constitutional: Negative for fatigue, fever and unexpected weight change.  Respiratory: Positive for cough. Negative for shortness of breath and wheezing.   Cardiovascular: Negative for chest pain, palpitations and leg swelling.  Gastrointestinal: Negative for blood in stool, constipation, diarrhea, nausea and vomiting.  Endocrine: Negative for cold intolerance, heat intolerance and polyuria.  Genitourinary: Positive for dyspareunia and dysuria. Negative for flank pain, frequency, genital sores, hematuria, menstrual problem, pelvic pain, urgency, vaginal bleeding, vaginal discharge and vaginal pain.  Musculoskeletal: Negative for back pain, joint swelling and myalgias.  Skin: Negative for rash.  Neurological: Negative for dizziness, syncope, light-headedness, numbness and headaches.  Hematological: Negative for adenopathy.  Psychiatric/Behavioral: Negative for agitation, confusion, sleep disturbance and suicidal ideas. The patient is not nervous/anxious.      Objective: BP (!) 144/86   Pulse 74   Ht 5\' 4"  (1.626 m)   Wt 181 lb (82.1 kg)   BMI 31.07 kg/m    Physical Exam Constitutional:      Appearance: She is  well-developed.  Genitourinary:     Vagina, cervix, uterus, right adnexa and left adnexa normal.     No vaginal discharge, erythema or tenderness.     No cervical polyp.     Uterus is not enlarged or tender.     No right or left adnexal mass present.     Right adnexa not tender.     Left adnexa not tender.     Genitourinary Comments: ERYTHEMA AT INTROITUS  Neck:     Musculoskeletal:  Normal range of motion.     Thyroid: No thyromegaly.  Cardiovascular:     Rate and Rhythm: Normal rate and regular rhythm.     Heart sounds: Normal heart sounds. No murmur.  Pulmonary:     Effort: Pulmonary effort is normal.     Breath sounds: Normal breath sounds.  Chest:     Breasts:        Right: No mass, nipple discharge, skin change or tenderness.        Left: No mass, nipple discharge, skin change or tenderness.  Abdominal:     Palpations: Abdomen is soft.     Tenderness: There is no abdominal tenderness. There is no guarding.  Musculoskeletal: Normal range of motion.  Neurological:     Mental Status: She is alert and oriented to person, place, and time.     Cranial Nerves: No cranial nerve deficit.  Psychiatric:        Behavior: Behavior normal.  Vitals signs reviewed.   RESULTS:  Results for orders placed or performed in visit on 01/20/18 (from the past 24 hour(s))  POCT Wet Prep with KOH     Status: Normal   Collection Time: 01/20/18  4:24 PM  Result Value Ref Range   Trichomonas, UA Negative    Clue Cells Wet Prep HPF POC neg    Epithelial Wet Prep HPF POC     Yeast Wet Prep HPF POC neg    Bacteria Wet Prep HPF POC     RBC Wet Prep HPF POC     WBC Wet Prep HPF POC     KOH Prep POC Negative Negative     Assessment/Plan:  Encounter for annual routine gynecological examination  Cervical cancer screening - Plan: Cytology - PAP  Screening for HPV (human papillomavirus) - Plan: Cytology - PAP  Screening for breast cancer - Pt current on mammo - Plan: MM 3D SCREEN BREAST  BILATERAL  Vaginal itching - Pos ext exam/neg wet prep. Rx lotrisone crm. Line dry underwear. F/u prn.  - Plan: POCT Wet Prep with KOH, clotrimazole-betamethasone (LOTRISONE) cream        GYN counsel mammography screening, menopause, adequate intake of calcium and vitamin D, diet and exercise    F/U  Return in about 1 year (around 01/21/2019).   B. , PA-C 01/20/2018 4:27 PM

## 2018-01-20 NOTE — Patient Instructions (Signed)
I value your feedback and entrusting us with your care. If you get a [place] patient survey, I would appreciate you taking the time to let us know about your experience today. Thank you! 

## 2018-01-23 LAB — CYTOLOGY - PAP
Diagnosis: NEGATIVE
HPV (WINDOPATH): NOT DETECTED

## 2018-02-26 ENCOUNTER — Telehealth: Payer: Self-pay

## 2018-02-26 NOTE — Telephone Encounter (Signed)
Pt says she is having a little pain in pelvic area, wondering if it would be in any way related to the yeast infection she had? She used the cream for 3 weeks, no discharge/odor/irritation, maybe a little itchiness at night but that's it. Please advise.

## 2018-02-26 NOTE — Telephone Encounter (Signed)
Pelvic pain is not related to yeast vag. Does she have any UTI sx or constipation/diarrhea symptoms with pain?

## 2018-02-26 NOTE — Telephone Encounter (Signed)
Pt states the cream worked a little; still has same problem.  [phone]

## 2018-02-26 NOTE — Telephone Encounter (Signed)
Called and left vm to call back, trying to get more info on symptoms going on now.

## 2018-02-27 NOTE — Telephone Encounter (Signed)
Pt says she is cramping at times, burns a little when urinates and that there is frequency, no other UTI symptoms and no constipation/diarrhea.

## 2018-02-27 NOTE — Telephone Encounter (Signed)
Called and LVMTRC. 

## 2018-02-27 NOTE — Telephone Encounter (Signed)
Pt should get checked for UTI. Can do with PCP or here.

## 2018-02-27 NOTE — Telephone Encounter (Signed)
Transferred to Sara to schedule appt. 

## 2018-03-04 ENCOUNTER — Ambulatory Visit: Payer: Federal, State, Local not specified - PPO | Admitting: Obstetrics and Gynecology

## 2018-03-17 ENCOUNTER — Ambulatory Visit (INDEPENDENT_AMBULATORY_CARE_PROVIDER_SITE_OTHER): Payer: Federal, State, Local not specified - PPO | Admitting: Obstetrics and Gynecology

## 2018-03-17 ENCOUNTER — Encounter: Payer: Self-pay | Admitting: Obstetrics and Gynecology

## 2018-03-17 VITALS — BP 128/80 | HR 70 | Ht 64.0 in | Wt 175.0 lb

## 2018-03-17 DIAGNOSIS — N761 Subacute and chronic vaginitis: Secondary | ICD-10-CM

## 2018-03-17 LAB — POCT WET PREP WITH KOH
CLUE CELLS WET PREP PER HPF POC: NEGATIVE
KOH PREP POC: NEGATIVE
TRICHOMONAS UA: NEGATIVE
YEAST WET PREP PER HPF POC: NEGATIVE

## 2018-03-17 MED ORDER — FLUCONAZOLE 150 MG PO TABS: 150.0000 mg | ORAL_TABLET | Freq: Once | ORAL | 0 refills | Status: AC

## 2018-03-17 NOTE — Addendum Note (Signed)
Addended by: Althea Grimmer B on: 03/17/2018 05:00 PM   Modules accepted: Orders

## 2018-03-17 NOTE — Patient Instructions (Signed)
I value your feedback and entrusting us with your care. If you get a [place] patient survey, I would appreciate you taking the time to let us know about your experience today. Thank you! 

## 2018-03-17 NOTE — Progress Notes (Addendum)
Diana MinaHedrick, [name], MD   Chief Complaint  Patient presents with  . Vaginitis    burning/itching sensation, little discharge and odor    HPI:      [name] is a 63 y.o. [id number] who LMP was No LMP recorded. Patient is postmenopausal., presents today for continued vaginal irritation/itch/burning, no increased d/c or odor. Sx started 12/19 after several rounds abx. Had neg wet prep/pos exam 01/20/18 and treated with lotrisone crm for 2 wks with No sx relief. Pt is using water to clean, line drying underwear, but had continued to use wet wipes vaginally to clean. Pt stopped them a wk ago and sx are a little improved now. Has noticed a fissure vaginally.    Past Medical History:  Diagnosis Date  . Anginal pain (HCC)    stable  . Arthritis    feet  . Cough    AND CHEST CONGESTION, ON ANTIBIOTICS  . Dysmenorrhea   . Hallux rigidus of right foot   . Hyperlipidemia   . Hypertension    borderline  . Palpitations     Past Surgical History:  Procedure Laterality Date  . APPENDECTOMY    . ARTHRODESIS METATARSALPHALANGEAL JOINT (MTPJ) Left    first mtp joint  . CARDIAC CATHETERIZATION  06/05/12   [facility] - report in paper chart  . CESAREAN SECTION  1985, 1989  . COLONOSCOPY    . COLONOSCOPY    . FOOT ARTHRODESIS Right 07/11/2016   Procedure: ARTHRODESIS FOOT-1ST MTPJ FUSION-RIGHT;  Surgeon: Gwyneth RevelsFowler, Justin, DPM;  [facility];  Service: Podiatry;  Laterality: Right;  . NECK SURGERY Right    anterior  . SQUAMOUS CELL CARCINOMA EXCISION Right    glossal  . TONGUE SURGERY Right    glossal squamous cell carcinoma, smoker    Family History  Problem Relation Age of Onset  . Hypertension Mother   . Hypertension Father   . Diabetes Sister 12       Type 1  . Hypertension Sister   . Diabetes Brother        Type 2  . Diabetes Sister        Type 2  . Breast cancer Neg Hx     Social History   Socioeconomic History  . Marital status: Married    Spouse name:  Not on file  . Number of children: Not on file  . Years of education: Not on file  . Highest education level: Not on file  Occupational History  . Not on file  Social Needs  . Financial resource strain: Not on file  . Food insecurity:    Worry: Not on file    Inability: Not on file  . Transportation needs:    Medical: Not on file    Non-medical: Not on file  Tobacco Use  . Smoking status: Never Smoker  . Smokeless tobacco: Never Used  Substance and Sexual Activity  . Alcohol use: Yes    Comment: "glass of wine every couple of weeks"  . Drug use: No  . Sexual activity: Yes    Birth control/protection: Post-menopausal  Lifestyle  . Physical activity:    Days per week: Not on file    Minutes per session: Not on file  . Stress: Not on file  Relationships  . Social connections:    Talks on phone: Not on file    Gets together: Not on file    Attends religious service: Not on file    Active  member of club or organization: Not on file    Attends meetings of clubs or organizations: Not on file    Relationship status: Not on file  . Intimate partner violence:    Fear of current or ex partner: Not on file    Emotionally abused: Not on file    Physically abused: Not on file    Forced sexual activity: Not on file  Other Topics Concern  . Not on file  Social History Narrative  . Not on file    Outpatient Medications Prior to Visit  Medication Sig Dispense Refill  . benzonatate (TESSALON) 100 MG capsule Take by mouth 3 (three) times daily as needed for cough (as needed for coughing).    . Calcium Carbonate 1500 (600 CA) MG TABS Take by mouth daily.    . Cholecalciferol ([employer]S VIT D 5000 HIGH-POTENCY PO) Take by mouth daily.    . clotrimazole-betamethasone (LOTRISONE) cream Apply externally BID prn sx up to 2 wks 15 g 0  . glipiZIDE (GLUCOTROL XL) 2.5 MG 24 hr tablet TAKE ONE TABLET BY MOUTH EVERY DAY    . glucose blood (ACCU-CHEK COMPACT PLUS) test strip Use 2 (two) times daily     . ipratropium (ATROVENT) 0.03 % nasal spray Place into the nose.    Marland Kitchen. Specialty Vitamins Products (MENOPAUSE SUPPORT PO) Take by mouth daily.     No facility-administered medications prior to visit.       ROS:  Review of Systems  Constitutional: Negative for fever.  Gastrointestinal: Negative for blood in stool, constipation, diarrhea, nausea and vomiting.  Genitourinary: Positive for vaginal discharge and vaginal pain. Negative for dyspareunia, dysuria, flank pain, frequency, hematuria, urgency and vaginal bleeding.  Musculoskeletal: Negative for back pain.  Skin: Negative for rash.   BREAST: No symptoms   OBJECTIVE:   Vitals:  BP 128/80   Pulse 70   Ht 5\' 4"  (1.626 m)   Wt 175 lb (79.4 kg)   BMI 30.04 kg/m   Physical Exam Vitals signs reviewed.  Constitutional:      Appearance: She is well-developed.  Pulmonary:     Effort: Pulmonary effort is normal.  Genitourinary:    Pubic Area: No rash.      Labia:        Right: Rash and tenderness present. No lesion.        Left: Rash and tenderness present. No lesion.      Vagina: Normal. No vaginal discharge, erythema or tenderness.     Cervix: Normal.     Uterus: Normal. Not enlarged and not tender.      Adnexa: Right adnexa normal and left adnexa normal.       Right: No mass or tenderness.         Left: No mass or tenderness.       Comments: EXTENSIVE IRRITATION/ERYTHEMA BILAT LABIA MAJORA/MINORA TO PERINEAL AREA; NO LESIONS; FISSURE AT SUPERIOR ASPECT OF BILAT LABIA MAJORA Musculoskeletal: Normal range of motion.  Neurological:     Mental Status: She is alert and oriented to person, place, and time.  Psychiatric:        Behavior: Behavior normal.        Thought Content: Thought content normal.     Results: Results for orders placed or performed in visit on 03/17/18 (from the past 24 hour(s))  POCT Wet Prep with KOH     Status: Normal   Collection Time: 03/17/18  4:32 PM  Result Value Ref Range   Trichomonas,  UA Negative    Clue Cells Wet Prep HPF POC neg    Epithelial Wet Prep HPF POC     Yeast Wet Prep HPF POC neg    Bacteria Wet Prep HPF POC     RBC Wet Prep HPF POC     WBC Wet Prep HPF POC     KOH Prep POC Negative Negative     Assessment/Plan: Subacute vaginitis - POS ext exam, neg wet prep. No relief with lotrisone crm. Rx diflucan x 2 doses. Check ONe Swab AV and yeast. Will f/u. Cold compresses/sitz baths - Plan: fluconazole (DIFLUCAN) 150 MG tablet, POCT Wet Prep with KOH, Other/Misc lab test    Meds ordered this encounter  Medications  . fluconazole (DIFLUCAN) 150 MG tablet    Sig: Take 1 tablet (150 mg total) by mouth once for 1 dose. Repeat after 3 days    Dispense:  2 tablet    Refill:  0    Order Specific Question:   Supervising Provider    Answer:   Nadara Mustard [[id number]]      Return if symptoms worsen or fail to improve.  Meya Clutter B. Nickcole Bralley, PA-C 03/17/2018 5:00 PM

## 2018-03-24 ENCOUNTER — Telehealth: Payer: Self-pay | Admitting: Obstetrics and Gynecology

## 2018-03-24 DIAGNOSIS — N761 Subacute and chronic vaginitis: Secondary | ICD-10-CM

## 2018-03-24 MED ORDER — MOXIFLOXACIN HCL 400 MG PO TABS: 400.0000 mg | ORAL_TABLET | Freq: Every day | ORAL | 0 refills | Status: AC

## 2018-03-24 NOTE — Telephone Encounter (Signed)
LM with One Swab culture results showing candida albicans and Entero faecalis. Pt already on diflucan x 2 doses. Rx avelox eRxd given sx. F/u prn.

## 2018-08-21 ENCOUNTER — Ambulatory Visit
Admission: RE | Admit: 2018-08-21 | Discharge: 2018-08-21 | Disposition: A | Discharge status: Home / Self Care | Payer: Federal, State, Local not specified - PPO | Source: Ambulatory Visit | Attending: Obstetrics and Gynecology | Admitting: Obstetrics and Gynecology

## 2018-08-21 ENCOUNTER — Other Ambulatory Visit: Payer: Self-pay

## 2018-08-21 ENCOUNTER — Encounter: Payer: Self-pay | Admitting: Obstetrics and Gynecology

## 2018-08-21 DIAGNOSIS — Z1231 Encounter for screening mammogram for malignant neoplasm of breast: Secondary | ICD-10-CM | POA: Diagnosis not present

## 2018-08-21 DIAGNOSIS — Z1239 Encounter for other screening for malignant neoplasm of breast: Secondary | ICD-10-CM

## 2019-01-25 NOTE — Progress Notes (Signed)
PCP: Maryland Pink, MD   Chief Complaint  Patient presents with  . Gynecologic Exam  . Pelvic Pain    for the past week on/off, no uti sx    HPI:      [name] is a 63 y.o. G2P2002 who LMP was No LMP recorded. Patient is postmenopausal., presents today for her annual examination.  Her menses are absent due to menopause.  She does not have intermenstrual bleeding. She does have tolerable vasomotor sx.   Sex activity: single partner, contraception - post menopausal status. She does have vaginal dryness. Having occas dyspareunia with achy, intermittent pain for past wk. No other aggrav factors other than sex. No vag, urin, GI sx. Has BM BID, normal for pt.   Last Pap:   Results were:01/20/18 no abnormalities /neg HPV DNA.  Hx of STDs: none  Last mammogram: 08/21/18  Results were: normal--routine follow-up in 12 months There is a FH of breast cancer in her pat cousin, genetic testing not indicated for pt. There is no FH of ovarian cancer. The patient does do self-breast exams.  Colonoscopy: colonoscopy 9/20 without abnormalities. Repeat due after 10 years.   Tobacco use: The patient denies current or previous tobacco use. Alcohol use: none  No drug use. Exercise: moderately active  She does get adequate calcium and Vitamin D in her diet.  Labs with PCP.   Past Medical History:  Diagnosis Date  . Anginal pain (HCC)    stable  . Arthritis    feet  . Cough    AND CHEST CONGESTION, ON ANTIBIOTICS  . Dysmenorrhea   . Hallux rigidus of right foot   . Hyperlipidemia   . Hypertension    borderline  . Palpitations     Past Surgical History:  Procedure Laterality Date  . APPENDECTOMY    . ARTHRODESIS METATARSALPHALANGEAL JOINT (MTPJ) Left    first mtp joint  . CARDIAC CATHETERIZATION  06/05/12   [facility] - report in paper chart  . [place]  . COLONOSCOPY    . COLONOSCOPY    . FOOT ARTHRODESIS Right 07/11/2016   Procedure: ARTHRODESIS FOOT-1ST MTPJ  FUSION-RIGHT;  Surgeon: Samara Deist, DPM;  Location: [place];  Service: Podiatry;  Laterality: Right;  . NECK SURGERY Right    anterior  . SQUAMOUS CELL CARCINOMA EXCISION Right    glossal  . TONGUE SURGERY Right    glossal squamous cell carcinoma, smoker    Family History  Problem Relation Age of Onset  . Hypertension Mother   . Hypertension Father   . Diabetes Sister 12       Type 1  . Hypertension Sister   . Diabetes Brother        Type 2  . Diabetes Sister        Type 2  . Breast cancer Neg Hx     Social History   Socioeconomic History  . Marital status: Married    Spouse name: Not on file  . Number of children: Not on file  . Years of education: Not on file  . Highest education level: Not on file  Occupational History  . Not on file  Tobacco Use  . Smoking status: Never Smoker  . Smokeless tobacco: Never Used  Substance and Sexual Activity  . Alcohol use: Yes    Comment: "glass of wine every couple of weeks"  . Drug use: No  . Sexual activity: Yes    Birth control/protection: Post-menopausal  Other Topics Concern  . Not on file  Social History Narrative  . Not on file   Social Determinants of Health   Financial Resource Strain:   . Difficulty of Paying Living Expenses: Not on file  Food Insecurity:   . Worried About Programme researcher, broadcasting/film/videounning Out of Food in the Last Year: Not on file  . Ran Out of Food in the Last Year: Not on file  Transportation Needs:   . Lack of Transportation (Medical): Not on file  . Lack of Transportation (Non-Medical): Not on file  Physical Activity:   . Days of Exercise per Week: Not on file  . Minutes of Exercise per Session: Not on file  Stress:   . Feeling of Stress : Not on file  Social Connections:   . Frequency of Communication with Friends and Family: Not on file  . Frequency of Social Gatherings with Friends and Family: Not on file  . Attends Religious Services: Not on file  . Active Member of Clubs or Organizations:  Not on file  . Attends BankerClub or Organization Meetings: Not on file  . Marital Status: Not on file  Intimate Partner Violence:   . Fear of Current or Ex-Partner: Not on file  . Emotionally Abused: Not on file  . Physically Abused: Not on file  . Sexually Abused: Not on file    Current Meds  Medication Sig  . benzonatate (TESSALON) 100 MG capsule Take by mouth 3 (three) times daily as needed for cough (as needed for coughing).  . Calcium Carbonate 1500 (600 CA) MG TABS Take by mouth daily.  . Cholecalciferol (CVS VIT D 5000 HIGH-POTENCY PO) Take by mouth daily.  . clotrimazole-betamethasone (LOTRISONE) cream Apply externally BID prn sx up to 2 wks  . GLIPIZIDE XL 5 MG 24 hr tablet Take 5 mg by mouth daily.  Marland Kitchen. glucose blood (PRECISION QID TEST) test strip Use 1 each (1 strip total) 2 (two) times daily  . Specialty Vitamins Products (MENOPAUSE SUPPORT PO) Take by mouth daily.      ROS:  Review of Systems  Constitutional: Negative for fatigue, fever and unexpected weight change.  Respiratory: Negative for cough, shortness of breath and wheezing.   Cardiovascular: Negative for chest pain, palpitations and leg swelling.  Gastrointestinal: Negative for blood in stool, constipation, diarrhea, nausea and vomiting.  Endocrine: Negative for cold intolerance, heat intolerance and polyuria.  Genitourinary: Positive for dyspareunia and pelvic pain. Negative for dysuria, flank pain, frequency, genital sores, hematuria, menstrual problem, urgency, vaginal bleeding, vaginal discharge and vaginal pain.  Musculoskeletal: Negative for back pain, joint swelling and myalgias.  Skin: Negative for rash.  Neurological: Negative for dizziness, syncope, light-headedness, numbness and headaches.  Hematological: Negative for adenopathy.  Psychiatric/Behavioral: Negative for agitation, confusion, sleep disturbance and suicidal ideas. The patient is not nervous/anxious.      Objective: BP 130/80   Ht 5\' 4"   (1.626 m)   Wt 180 lb (81.6 kg)   BMI 30.90 kg/m    Physical Exam Constitutional:      Appearance: She is well-developed.  Genitourinary:     Vulva, vagina, cervix, uterus, right adnexa and left adnexa normal.     No vulval lesion or tenderness noted.     No vaginal discharge, erythema or tenderness.     No cervical polyp.     Uterus is not enlarged or tender.     No right or left adnexal mass present.     Right adnexa not tender.  Left adnexa not tender.  Neck:     Thyroid: No thyromegaly.  Cardiovascular:     Rate and Rhythm: Normal rate and regular rhythm.     Heart sounds: Normal heart sounds. No murmur.  Pulmonary:     Effort: Pulmonary effort is normal.     Breath sounds: Normal breath sounds.  Chest:     Breasts:        Right: No mass, nipple discharge, skin change or tenderness.        Left: No mass, nipple discharge, skin change or tenderness.  Abdominal:     Palpations: Abdomen is soft.     Tenderness: There is no abdominal tenderness. There is no guarding.  Musculoskeletal:        General: Normal range of motion.     Cervical back: Normal range of motion.  Neurological:     General: No focal deficit present.     Mental Status: She is alert and oriented to person, place, and time.     Cranial Nerves: No cranial nerve deficit.  Skin:    General: Skin is warm and dry.  Psychiatric:        Mood and Affect: Mood normal.        Behavior: Behavior normal.        Thought Content: Thought content normal.        Judgment: Judgment normal.  Vitals reviewed.    Assessment/Plan:  Encounter for annual routine gynecological examination  Encounter for screening mammogram for malignant neoplasm of breast; pt current on mammo  Screening for colon cancer--pt had colonoscopy 2020  Pelvic pain - Plan: US Transvaginal Non-OB; Neg exam. Check GYN u/s. If neg, can follow expectantly since sx intermittent and not severe        GYN counsel mammography screening,  menopause, adequate intake of calcium and vitamin D, diet and exercise    F/U  Return in about 1 day (around 01/27/2019) for GYN u/s for pelvic pain--[facility] to call pt.  Malacki Mcphearson B. Alaysia Lightle, PA-C 01/26/2019 11:38 AM

## 2019-01-26 ENCOUNTER — Encounter: Payer: Self-pay | Admitting: Obstetrics and Gynecology

## 2019-01-26 ENCOUNTER — Ambulatory Visit (INDEPENDENT_AMBULATORY_CARE_PROVIDER_SITE_OTHER): Payer: Federal, State, Local not specified - PPO | Admitting: Obstetrics and Gynecology

## 2019-01-26 ENCOUNTER — Other Ambulatory Visit: Payer: Self-pay

## 2019-01-26 VITALS — BP 130/80 | Ht 64.0 in | Wt 180.0 lb

## 2019-01-26 DIAGNOSIS — Z01419 Encounter for gynecological examination (general) (routine) without abnormal findings: Secondary | ICD-10-CM | POA: Diagnosis not present

## 2019-01-26 DIAGNOSIS — R102 Pelvic and perineal pain: Secondary | ICD-10-CM

## 2019-01-26 DIAGNOSIS — Z1211 Encounter for screening for malignant neoplasm of colon: Secondary | ICD-10-CM

## 2019-01-26 DIAGNOSIS — Z1231 Encounter for screening mammogram for malignant neoplasm of breast: Secondary | ICD-10-CM

## 2019-01-26 NOTE — Patient Instructions (Signed)
I value your feedback and entrusting us with your care. If you get a Diana Booth patient survey, I would appreciate you taking the time to let us know about your experience today. Thank you!  As of January 15, 2019, your lab results will be released to your MyChart immediately, before I even have a chance to see them. Please give me time to review them and contact you if there are any abnormalities. Thank you for your patience.  

## 2019-02-09 ENCOUNTER — Other Ambulatory Visit: Payer: Self-pay

## 2019-02-09 ENCOUNTER — Ambulatory Visit (INDEPENDENT_AMBULATORY_CARE_PROVIDER_SITE_OTHER): Payer: Federal, State, Local not specified - PPO

## 2019-02-09 DIAGNOSIS — R102 Pelvic and perineal pain: Secondary | ICD-10-CM | POA: Diagnosis not present

## 2019-02-10 ENCOUNTER — Telehealth: Payer: Self-pay | Admitting: Obstetrics and Gynecology

## 2019-02-10 NOTE — Telephone Encounter (Signed)
LM with neg GYN u/s results for dyspareunia. Small leio, not likely cause of sx. No PMB. F/u prn.   ULTRASOUND REPORT  Location: [facility]  Date of Service: 02/09/2019     Indications:Abnormal Uterine Bleeding Findings:  The uterus is axial and measures 8.7 x 5.2 x 4.8 cm. Echo texture is heterogenous with evidence of focal masses. Within the uterus are multiple suspected fibroids measuring: Fibroid 1:30.5 x 28.7 x 29.1 mm partially submucosal, partially calcified The Endometrium is not clearly visualized.  Right Ovary measures 2.3 x 1.3 x 1.6 cm. It is normal in appearance. Left Ovary measures 1.8 x 1.2 x 1.3 cm. It is normal in appearance. Survey of the adnexa demonstrates no adnexal masses. There is no free fluid in the cul de sac.  Impression: 1. The endometrium is not clearly visualized due to a partially submucosal fibroid 2. Normal appearing cervix and ovaries.   Recommendations: 1.Clinical correlation with the patient's History and Physical Exam. 2. Fibroid management options to be discussed  Deanna Artis, RT   Review of ULTRASOUND.    I have personally reviewed images and report of recent ultrasound done at [facility].    Plan of management to be discussed with pa Gil Ingwersen  Annamarie Major, MD, [facility], [facility] 02/10/2019  4:36 PM

## 2019-08-31 ENCOUNTER — Ambulatory Visit
Admission: RE | Admit: 2019-08-31 | Discharge: 2019-08-31 | Disposition: A | Discharge status: Home / Self Care | Payer: Federal, State, Local not specified - PPO | Source: Ambulatory Visit | Attending: Obstetrics and Gynecology | Admitting: Obstetrics and Gynecology

## 2019-08-31 DIAGNOSIS — Z1231 Encounter for screening mammogram for malignant neoplasm of breast: Secondary | ICD-10-CM

## 2019-09-01 ENCOUNTER — Encounter: Payer: Self-pay | Admitting: Obstetrics and Gynecology

## 2020-01-27 ENCOUNTER — Ambulatory Visit: Payer: Federal, State, Local not specified - PPO | Admitting: Obstetrics and Gynecology

## 2020-03-02 NOTE — Progress Notes (Signed)
PCP: Jerl Mina, MD   Chief Complaint  Patient presents with  . Gynecologic Exam    HPI:      [name] is a 65 y.o. G2P2002 who LMP was No LMP recorded. Patient is postmenopausal., presents today for her annual examination.  Her menses are absent due to menopause.  She does not have intermenstrual bleeding. She does have occas vasomotor sx.   Sex activity: single partner, contraception - post menopausal status. She does have vaginal dryness. Uses lubricants with some relief. Dyspareunia/pelvic pain from last yr resolved. Had neg GYN u/s. Last Pap:   Results were:01/20/18 no abnormalities /neg HPV DNA.  Hx of STDs: none  Last mammogram: 08/31/19  Results were: normal--routine follow-up in 12 months There is a FH of breast cancer in her pat cousin, genetic testing not indicated for pt. There is no FH of ovarian cancer. The patient does do self-breast exams.  Colonoscopy: colonoscopy 9/20 without abnormalities. Repeat due after 10 years.   Tobacco use: The patient denies current or previous tobacco use. Alcohol use: none  No drug use. Exercise: moderately active  She does get adequate calcium and Vitamin D in her diet.  Labs with PCP.   Past Medical History:  Diagnosis Date  . Anginal pain (HCC)    stable  . Arthritis    feet  . Cough    AND CHEST CONGESTION, ON ANTIBIOTICS  . Dysmenorrhea   . Hallux rigidus of right foot   . Hyperlipidemia   . Hypertension    borderline  . Palpitations     Past Surgical History:  Procedure Laterality Date  . APPENDECTOMY    . ARTHRODESIS METATARSALPHALANGEAL JOINT (MTPJ) Left    first mtp joint  . CARDIAC CATHETERIZATION  06/05/12   [facility] - report in paper chart  . CESAREAN SECTION  1985, 1989  . COLONOSCOPY    . COLONOSCOPY    . FOOT ARTHRODESIS Right 07/11/2016   Procedure: ARTHRODESIS FOOT-1ST MTPJ FUSION-RIGHT;  Surgeon: Gwyneth Revels, DPM;  Location: [facility];  Service: Podiatry;  Laterality: Right;   . NECK SURGERY Right    anterior  . SQUAMOUS CELL CARCINOMA EXCISION Right    glossal  . TONGUE SURGERY Right    glossal squamous cell carcinoma, smoker    Family History  Problem Relation Age of Onset  . Hypertension Mother   . Hypertension Father   . Diabetes Sister 12       Type 1  . Hypertension Sister   . Diabetes Brother        Type 2  . Diabetes Sister        Type 2  . Breast cancer Neg Hx     Social History   Socioeconomic History  . Marital status: Married    Spouse name: Not on file  . Number of children: Not on file  . Years of education: Not on file  . Highest education level: Not on file  Occupational History  . Not on file  Tobacco Use  . Smoking status: Never Smoker  . Smokeless tobacco: Never Used  Vaping Use  . Vaping Use: Never used  Substance and Sexual Activity  . Alcohol use: Yes    Comment: "glass of wine every couple of weeks"  . Drug use: No  . Sexual activity: Yes    Birth control/protection: Post-menopausal  Other Topics Concern  . Not on file  Social History Narrative  . Not on file   Social  Determinants of Health   Financial Resource Strain: Not on file  Food Insecurity: Not on file  Transportation Needs: Not on file  Physical Activity: Not on file  Stress: Not on file  Social Connections: Not on file  Intimate Partner Violence: Not on file    Current Meds  Medication Sig  . Calcium Carbonate 1500 (600 CA) MG TABS Take by mouth daily.  . Cholecalciferol (CVS VIT D 5000 HIGH-POTENCY PO) Take by mouth daily.  . clotrimazole-betamethasone (LOTRISONE) cream Apply externally BID prn sx up to 2 wks  . GLIPIZIDE XL 5 MG 24 hr tablet Take 5 mg by mouth daily.  Marland Kitchen Specialty Vitamins Products (MENOPAUSE SUPPORT PO) Take by mouth daily.      ROS:  Review of Systems  Constitutional: Negative for fatigue, fever and unexpected weight change.  Respiratory: Negative for cough, shortness of breath and wheezing.   Cardiovascular:  Negative for chest pain, palpitations and leg swelling.  Gastrointestinal: Negative for blood in stool, constipation, diarrhea, nausea and vomiting.  Endocrine: Negative for cold intolerance, heat intolerance and polyuria.  Genitourinary: Negative for dyspareunia, dysuria, flank pain, frequency, genital sores, hematuria, menstrual problem, pelvic pain, urgency, vaginal bleeding, vaginal discharge and vaginal pain.  Musculoskeletal: Negative for back pain, joint swelling and myalgias.  Skin: Negative for rash.  Neurological: Negative for dizziness, syncope, light-headedness, numbness and headaches.  Hematological: Negative for adenopathy.  Psychiatric/Behavioral: Negative for agitation, confusion, sleep disturbance and suicidal ideas. The patient is not nervous/anxious.      Objective: BP 132/76   Pulse 60   Temp 97.6 F (36.4 C)   Resp 16   Ht 5\' 4"  (1.626 m)   Wt 177 lb 12.8 oz (80.6 kg)   SpO2 98%   BMI 30.52 kg/m    Physical Exam Constitutional:      Appearance: She is well-developed.  Genitourinary:     Vulva normal.     Right Labia: No rash, tenderness or lesions.    Left Labia: No tenderness, lesions or rash.    No vaginal discharge, erythema or tenderness.      Right Adnexa: not tender and no mass present.    Left Adnexa: not tender and no mass present.    No cervical friability or polyp.     Uterus is not enlarged or tender.  Breasts:     Right: No mass, nipple discharge, skin change or tenderness.     Left: No mass, nipple discharge, skin change or tenderness.    Neck:     Thyroid: No thyromegaly.  Cardiovascular:     Rate and Rhythm: Normal rate and regular rhythm.     Heart sounds: Normal heart sounds. No murmur heard.   Pulmonary:     Effort: Pulmonary effort is normal.     Breath sounds: Normal breath sounds.  Abdominal:     Palpations: Abdomen is soft.     Tenderness: There is no abdominal tenderness. There is no guarding or rebound.   Musculoskeletal:        General: Normal range of motion.     Cervical back: Normal range of motion.  Lymphadenopathy:     Cervical: No cervical adenopathy.  Neurological:     General: No focal deficit present.     Mental Status: She is alert and oriented to person, place, and time.     Cranial Nerves: No cranial nerve deficit.  Skin:    General: Skin is warm and dry.  Psychiatric:  Mood and Affect: Mood normal.        Behavior: Behavior normal.        Thought Content: Thought content normal.        Judgment: Judgment normal.  Vitals reviewed.    Assessment/Plan:  Encounter for annual routine gynecological examination  Encounter for screening mammogram for malignant neoplasm of breast - Plan: MM 3D SCREEN BREAST BILATERAL; pt to sched mammo 7/22   GYN counsel mammography screening, menopause, adequate intake of calcium and vitamin D, diet and exercise    F/U  Return in about 1 year (around 03/03/2021).  Corbet Hanley B. Ernestina Joe, PA-C 03/03/2020 1:47 PM

## 2020-03-03 ENCOUNTER — Encounter: Payer: Self-pay | Admitting: Obstetrics and Gynecology

## 2020-03-03 ENCOUNTER — Other Ambulatory Visit: Payer: Self-pay

## 2020-03-03 ENCOUNTER — Ambulatory Visit (INDEPENDENT_AMBULATORY_CARE_PROVIDER_SITE_OTHER): Payer: BC Managed Care – PPO | Admitting: Obstetrics and Gynecology

## 2020-03-03 VITALS — BP 132/76 | HR 60 | Temp 97.6°F | Resp 16 | Ht 64.0 in | Wt 177.8 lb

## 2020-03-03 DIAGNOSIS — Z01419 Encounter for gynecological examination (general) (routine) without abnormal findings: Secondary | ICD-10-CM | POA: Diagnosis not present

## 2020-03-03 DIAGNOSIS — Z1231 Encounter for screening mammogram for malignant neoplasm of breast: Secondary | ICD-10-CM

## 2020-03-03 NOTE — Patient Instructions (Addendum)
I value your feedback and you entrusting us with your care. If you get a [facility] patient survey, I would appreciate you taking the time to let us know about your experience today. Thank you!  [facility]: [phone]      

## 2020-08-31 ENCOUNTER — Ambulatory Visit
Admission: RE | Admit: 2020-08-31 | Discharge: 2020-08-31 | Disposition: A | Discharge status: Home / Self Care | Payer: Federal, State, Local not specified - PPO | Source: Ambulatory Visit | Attending: Obstetrics and Gynecology | Admitting: Obstetrics and Gynecology

## 2020-08-31 ENCOUNTER — Other Ambulatory Visit: Payer: Self-pay

## 2020-08-31 DIAGNOSIS — Z1231 Encounter for screening mammogram for malignant neoplasm of breast: Secondary | ICD-10-CM | POA: Diagnosis not present

## 2021-02-07 ENCOUNTER — Other Ambulatory Visit: Payer: Self-pay | Admitting: Family Medicine

## 2021-02-07 DIAGNOSIS — Z1231 Encounter for screening mammogram for malignant neoplasm of breast: Secondary | ICD-10-CM

## 2021-06-11 NOTE — Progress Notes (Signed)
? ?PCP: [name], [name], MD ? ? ?Chief Complaint  ?Patient presents with  ? Gynecologic Exam  ?  No concerns  ? ? ?HPI: ?     [name] is a 66 y.o. [phone] who LMP was No LMP recorded. Patient is postmenopausal., presents today for her annual examination.  Her menses are absent due to menopause.  She does not have PMB. She does have occas night sweats.  ? ?Sex activity: single partner, contraception - post menopausal status. She does have vaginal dryness. Not using lubricants.  ?Last Pap:   Results were:01/20/18 no abnormalities /neg HPV DNA.  ?Hx of STDs: none ? ?Last mammogram: 08/31/20  Results were: normal--routine follow-up in 12 months. Has appt 7/23 ?There is a FH of breast cancer in her pat cousin and now sister, genetic testing not indicated for pt. There is no FH of ovarian cancer. The patient does self-breast exams. ? ?Colonoscopy: colonoscopy 9/20 without abnormalities. Repeat due after 10 years.  ? ?Tobacco use: The patient denies current or previous tobacco use. ?Alcohol use: none  ?No drug use. ?Exercise: moderately active ? ?She does get adequate calcium and Vitamin D in her diet. ? ?Labs with PCP.  ? ?Past Medical History:  ?Diagnosis Date  ? Anginal pain (HCC)   ? stable  ? Arthritis   ? feet  ? Cough   ? AND CHEST CONGESTION, ON ANTIBIOTICS  ? Dysmenorrhea   ? Hallux rigidus of right foot   ? Hyperlipidemia   ? Hypertension   ? borderline  ? Palpitations   ? ? ?Past Surgical History:  ?Procedure Laterality Date  ? APPENDECTOMY    ? ARTHRODESIS METATARSALPHALANGEAL JOINT (MTPJ) Left   ? first mtp joint  ? CARDIAC CATHETERIZATION  06/05/12  ? [facility] - report in paper chart  ? CESAREAN SECTION  1985, 1989  ? COLONOSCOPY    ? COLONOSCOPY    ? FOOT ARTHRODESIS Right 07/11/2016  ? Procedure: ARTHRODESIS FOOT-1ST MTPJ FUSION-RIGHT;  Surgeon: Gwyneth RevelsFowler, Justin, DPM;  [facility];  Service: Podiatry;  Laterality: Right;  ? NECK SURGERY Right   ? anterior  ? SQUAMOUS CELL CARCINOMA EXCISION  Right   ? glossal  ? TONGUE SURGERY Right   ? glossal squamous cell carcinoma, smoker  ? ? ?Family History  ?Problem Relation Age of Onset  ? Hypertension Mother   ? Hypertension Father   ? Breast cancer Sister [id number]  ? Diabetes Sister 6712  ?     Type 1  ? Hypertension Sister   ? Diabetes Sister   ?     Type 2  ? Diabetes Brother   ?     Type 2  ? ? ?Social History  ? ?Socioeconomic History  ? Marital status: Married  ?  Spouse name: Not on file  ? Number of children: Not on file  ? Years of education: Not on file  ? Highest education level: Not on file  ?Occupational History  ? Not on file  ?Tobacco Use  ? Smoking status: Never  ? Smokeless tobacco: Never  ?Vaping Use  ? Vaping Use: Never used  ?Substance and Sexual Activity  ? Alcohol use: Yes  ?  Comment: "glass of wine every couple of weeks"  ? Drug use: No  ? Sexual activity: Yes  ?  Birth control/protection: Post-menopausal  ?Other Topics Concern  ? Not on file  ?Social History Narrative  ? Not on file  ? ?Social Determinants of Health  ? ?  Financial Resource Strain: Not on file  ?Food Insecurity: Not on file  ?Transportation Needs: Not on file  ?Physical Activity: Not on file  ?Stress: Not on file  ?Social Connections: Not on file  ?Intimate Partner Violence: Not on file  ? ? ?Current Meds  ?Medication Sig  ? Calcium Carbonate 1500 (600 CA) MG TABS Take by mouth daily.  ? Cholecalciferol (CVS VIT D 5000 HIGH-POTENCY PO) Take by mouth daily.  ? GLIPIZIDE XL 5 MG 24 hr tablet Take 5 mg by mouth daily.  ? glucose blood (PRECISION QID TEST) test strip Use 1 each (1 strip total) 2 (two) times daily  ? RYBELSUS 3 MG TABS Take 1 tablet by mouth daily.  ? ? ? ? ?ROS: ? ?Review of Systems  ?Constitutional:  Negative for fatigue, fever and unexpected weight change.  ?Respiratory:  Negative for cough, shortness of breath and wheezing.   ?Cardiovascular:  Negative for chest pain, palpitations and leg swelling.  ?Gastrointestinal:  Negative for blood in stool, constipation,  diarrhea, nausea and vomiting.  ?Endocrine: Negative for cold intolerance, heat intolerance and polyuria.  ?Genitourinary:  Negative for dyspareunia, dysuria, flank pain, frequency, genital sores, hematuria, menstrual problem, pelvic pain, urgency, vaginal bleeding, vaginal discharge and vaginal pain.  ?Musculoskeletal:  Negative for back pain, joint swelling and myalgias.  ?Skin:  Negative for rash.  ?Neurological:  Negative for dizziness, syncope, light-headedness, numbness and headaches.  ?Hematological:  Negative for adenopathy.  ?Psychiatric/Behavioral:  Negative for agitation, confusion, sleep disturbance and suicidal ideas. The patient is not nervous/anxious.   ? ? ?Objective: ?BP 128/70   Ht 5\' 4"  (1.626 m)   Wt 169 lb (76.7 kg)   BMI 29.01 kg/m?  ? ? ?Physical Exam ?Constitutional:   ?   Appearance: She is well-developed.  ?Genitourinary:  ?   Vulva normal.  ?   Right Labia: No rash, tenderness or lesions. ?   Left Labia: No tenderness, lesions or rash. ?   No vaginal discharge, erythema or tenderness.  ?   Mild vaginal atrophy present. ? ?   Right Adnexa: not tender and no mass present. ?   Left Adnexa: not tender and no mass present. ?   No cervical friability or polyp.  ?   Uterus is not enlarged or tender.  ?Breasts: ?   Right: No mass, nipple discharge, skin change or tenderness.  ?   Left: No mass, nipple discharge, skin change or tenderness.  ?Neck:  ?   Thyroid: No thyromegaly.  ?Cardiovascular:  ?   Rate and Rhythm: Normal rate and regular rhythm.  ?   Heart sounds: Normal heart sounds. No murmur heard. ?Pulmonary:  ?   Effort: Pulmonary effort is normal.  ?   Breath sounds: Normal breath sounds.  ?Abdominal:  ?   Palpations: Abdomen is soft.  ?   Tenderness: There is no abdominal tenderness. There is no guarding or rebound.  ?Musculoskeletal:     ?   General: Normal range of motion.  ?   Cervical back: Normal range of motion.  ?Lymphadenopathy:  ?   Cervical: No cervical adenopathy.   ?Neurological:  ?   General: No focal deficit present.  ?   Mental Status: She is alert and oriented to person, place, and time.  ?   Cranial Nerves: No cranial nerve deficit.  ?Skin: ?   General: Skin is warm and dry.  ?Psychiatric:     ?   Mood and Affect: Mood normal.     ?  Behavior: Behavior normal.     ?   Thought Content: Thought content normal.     ?   Judgment: Judgment normal.  ?Vitals reviewed.  ? ?Assessment/Plan: ?Encounter for annual routine gynecological examination ? ?Encounter for screening mammogram for malignant neoplasm of breast; pt has mammo 7/23 ? ?Postmenopausal vaginal dryness--try coconut oil. F/u prn.  ? ?GYN counsel mammography screening, menopause, adequate intake of calcium and vitamin D, diet and exercise ? ?  F/U ? Return in about 1 year (around 06/14/2022). ? ?Sihaam Chrobak B. Hershy Flenner, PA-C ?06/13/2021 ?1:54 PM ?

## 2021-06-13 ENCOUNTER — Ambulatory Visit (INDEPENDENT_AMBULATORY_CARE_PROVIDER_SITE_OTHER): Payer: Medicare HMO | Admitting: Obstetrics and Gynecology

## 2021-06-13 ENCOUNTER — Encounter: Payer: Self-pay | Admitting: Obstetrics and Gynecology

## 2021-06-13 VITALS — BP 128/70 | Ht 64.0 in | Wt 169.0 lb

## 2021-06-13 DIAGNOSIS — Z1231 Encounter for screening mammogram for malignant neoplasm of breast: Secondary | ICD-10-CM | POA: Diagnosis not present

## 2021-06-13 DIAGNOSIS — Z01419 Encounter for gynecological examination (general) (routine) without abnormal findings: Secondary | ICD-10-CM

## 2021-06-13 NOTE — Patient Instructions (Signed)
I value your feedback and you entrusting us with your care. If you get a [facility] patient survey, I would appreciate you taking the time to let us know about your experience today. Thank you! ? ? ?

## 2021-09-04 ENCOUNTER — Ambulatory Visit
Admission: RE | Admit: 2021-09-04 | Discharge: 2021-09-04 | Disposition: A | Discharge status: Home / Self Care | Payer: Medicare HMO | Source: Ambulatory Visit | Attending: Family Medicine | Admitting: Family Medicine

## 2021-09-04 DIAGNOSIS — Z1231 Encounter for screening mammogram for malignant neoplasm of breast: Secondary | ICD-10-CM | POA: Diagnosis not present

## 2022-01-27 IMAGING — MG MM DIGITAL SCREENING BILAT W/ TOMO AND CAD
8 series · 8 of 24 positions shown · non-contrast
Comparison: Previous exam(s).

CLINICAL DATA: Screening.

EXAM:
DIGITAL SCREENING BILATERAL MAMMOGRAM WITH TOMOSYNTHESIS AND CAD
TECHNIQUE: Bilateral screening digital craniocaudal and mediolateral oblique
mammograms were obtained. Bilateral screening digital breast
tomosynthesis was performed. The images were evaluated with
computer-aided detection.

[R MLO synth-2D]
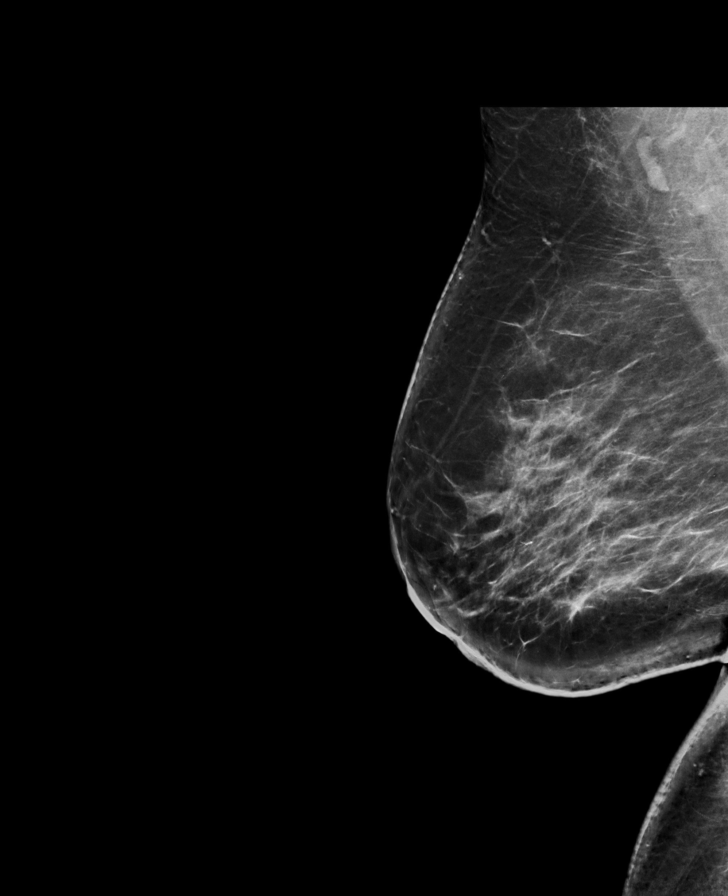

[L CC synth-2D]
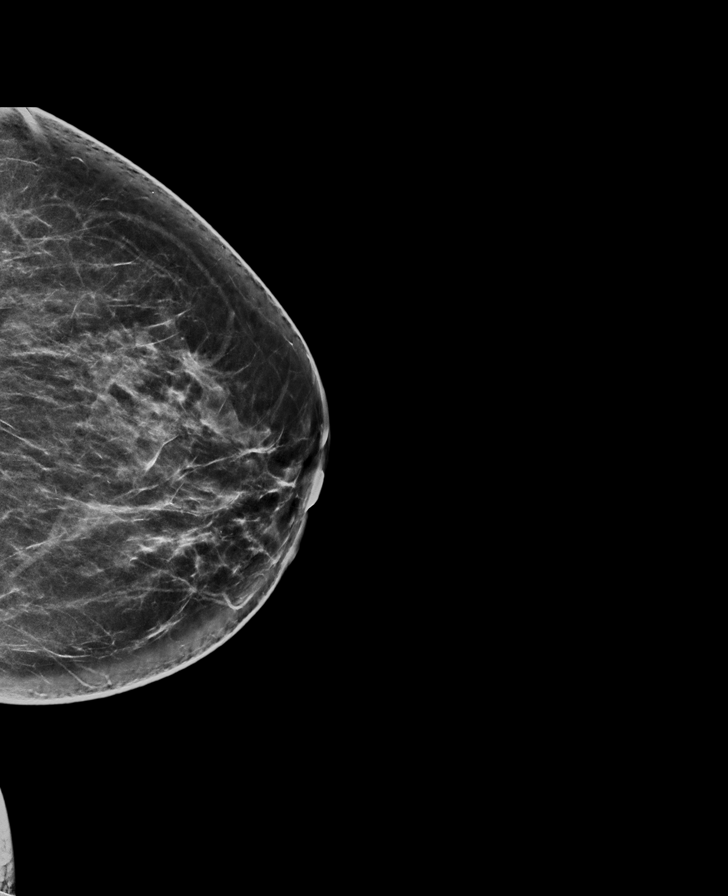

[R CC synth-2D]
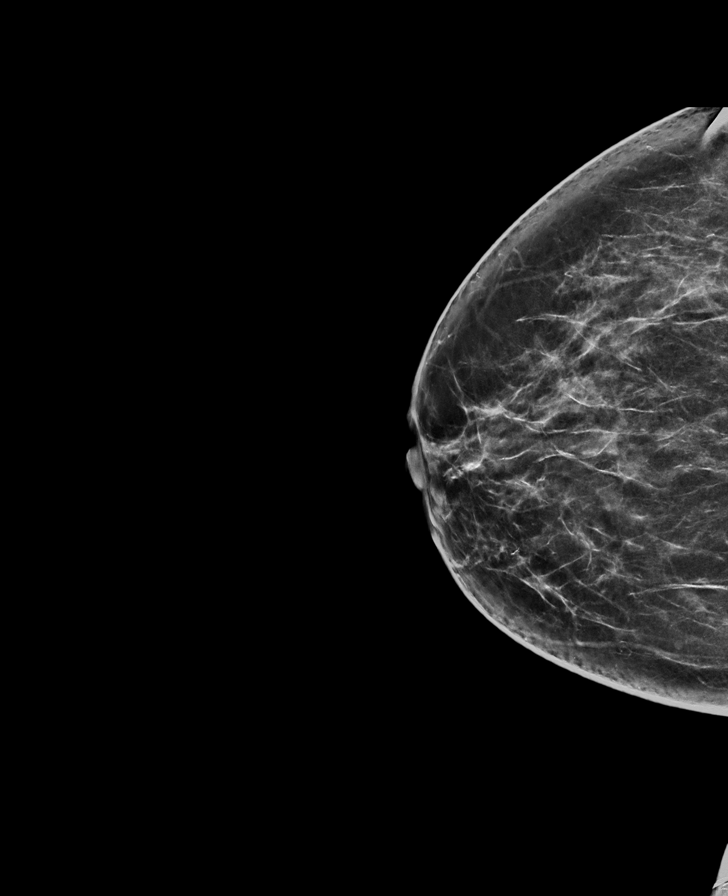

[L MLO synth-2D]
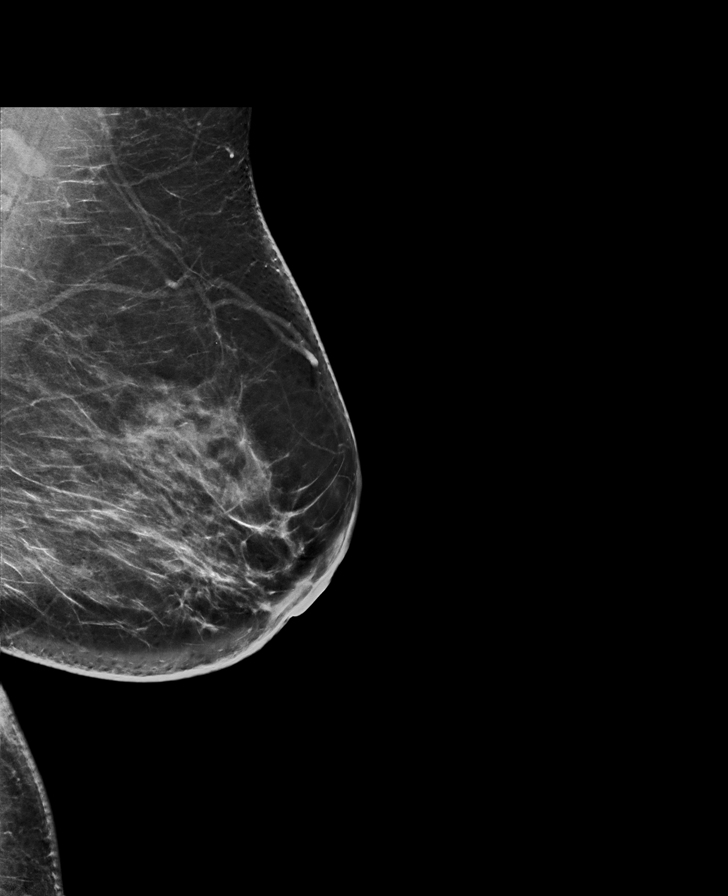

[L CC tomo · tomo slice 39/76.0]
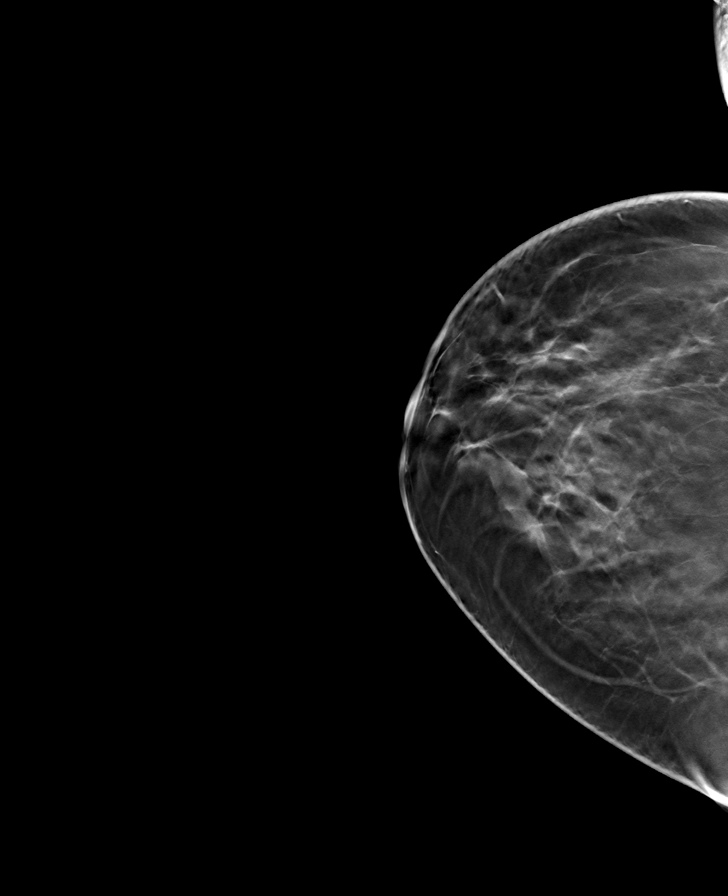

[L MLO tomo · tomo slice 43/84.0]
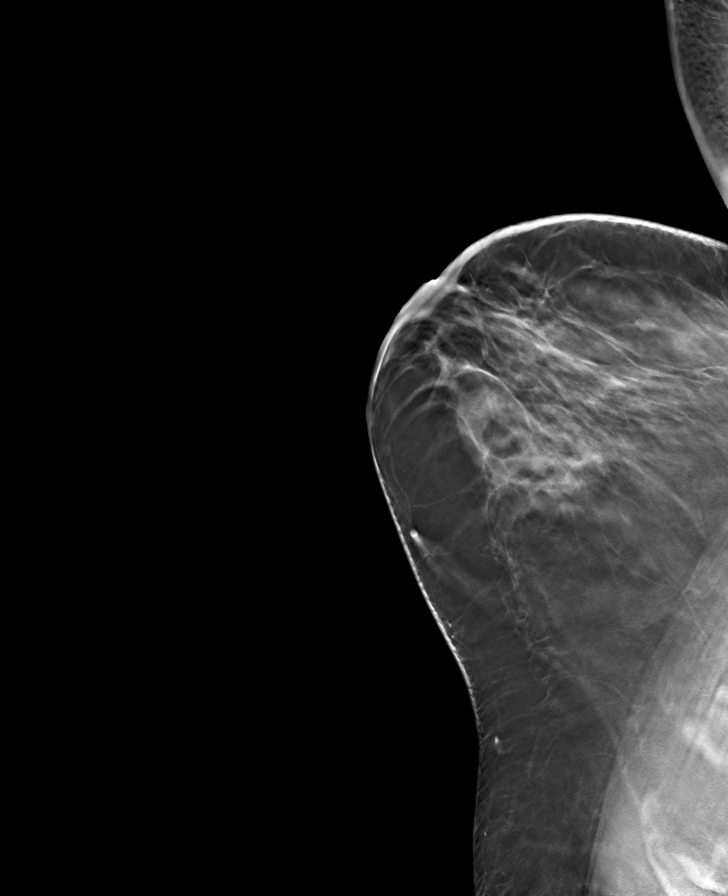

[R MLO tomo · tomo slice 47/92.0]
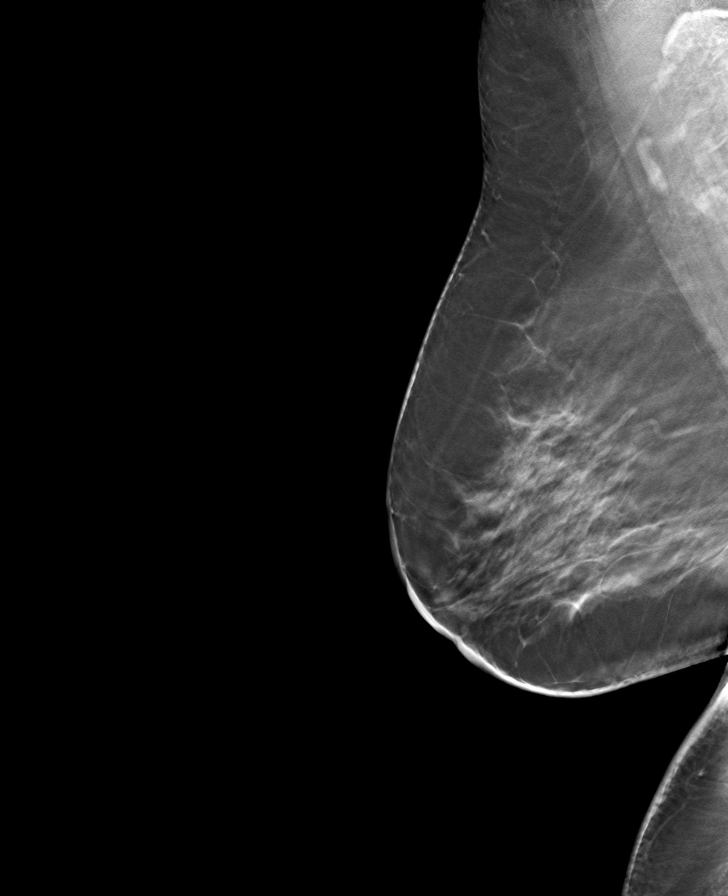

[R CC tomo · tomo slice 37/72.0]
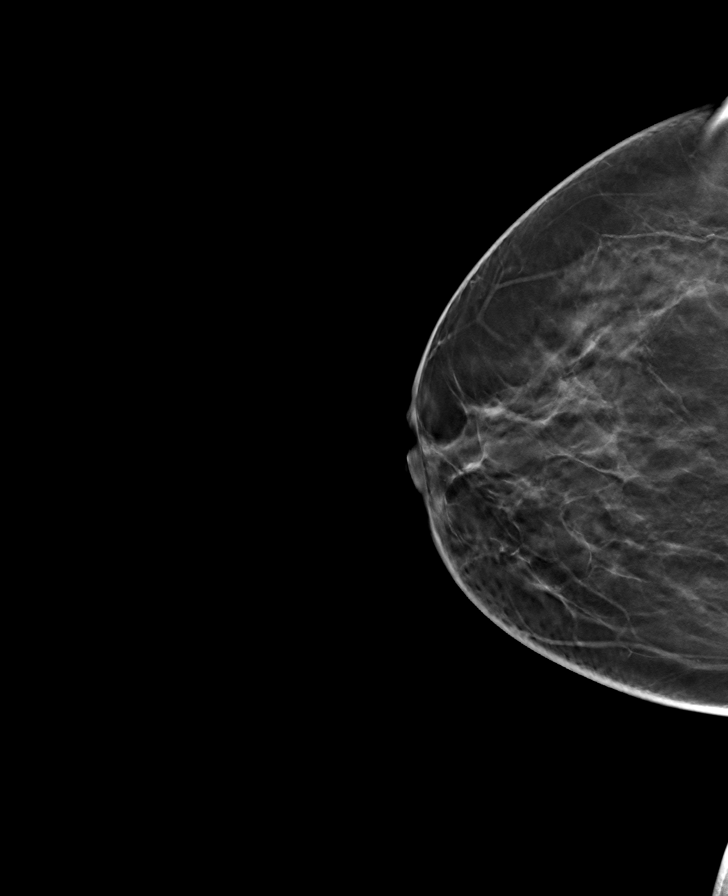

[8 of 24 positions shown; findings below may reference images not displayed]

ACR Breast Density Category c: The breast tissue is heterogeneously
dense, which may obscure small masses.
FINDINGS: There are no findings suspicious for malignancy.
IMPRESSION: No mammographic evidence of malignancy. A result letter of this
screening mammogram will be mailed directly to the patient.

RECOMMENDATION:
Screening mammogram in one year. (Code:Q3-W-BC3)

BI-RADS CATEGORY  1: Negative.

## 2022-06-25 ENCOUNTER — Telehealth: Payer: Self-pay

## 2022-06-25 NOTE — Telephone Encounter (Signed)
Diana Booth called triage line asking when her annual was scheduled for I advised her she didn't have any up coming appointments and she was due for her annual. I transferred her to the front to schedule.

## 2022-06-25 NOTE — Progress Notes (Unsigned)
Diana Mina, MD   No chief complaint on file.   HPI:      [name] is a 67 y.o. [id number] whose LMP was No LMP recorded. Patient is postmenopausal., presents today for ***  Neg mammo 09/04/21; FH breast cancer in her sister, genetic tesitng not indicated for pt  Past Medical History:  Diagnosis Date   Anginal pain (HCC)    stable   Arthritis    feet   Cough    AND CHEST CONGESTION, ON ANTIBIOTICS   Dysmenorrhea    Hallux rigidus of right foot    Hyperlipidemia    Hypertension    borderline   Palpitations      Past Surgical History:  Procedure Laterality Date   APPENDECTOMY     ARTHRODESIS METATARSALPHALANGEAL JOINT (MTPJ) Left    first mtp joint   CARDIAC CATHETERIZATION  06/05/12   [facility] - report in paper chart   CESAREAN SECTION  1985, 1989   COLONOSCOPY     COLONOSCOPY     FOOT ARTHRODESIS Right 07/11/2016   Procedure: ARTHRODESIS FOOT-1ST MTPJ FUSION-RIGHT;  Surgeon: Gwyneth Revels, DPM;  Location: [facility];  Service: Podiatry;  Laterality: Right;   NECK SURGERY Right    anterior   SQUAMOUS CELL CARCINOMA EXCISION Right    glossal   TONGUE SURGERY Right    glossal squamous cell carcinoma, smoker    Family History  Problem Relation Age of Onset   Hypertension Mother    Hypertension Father    Breast cancer Sister 34   Diabetes Sister 5       Type 1   Hypertension Sister    Diabetes Sister        Type 2   Diabetes Brother        Type 2    Social History   Socioeconomic History   Marital status: Married    Spouse name: Not on file   Number of children: Not on file   Years of education: Not on file   Highest education level: Not on file  Occupational History   Not on file  Tobacco Use   Smoking status: Never   Smokeless tobacco: Never  Vaping Use   Vaping Use: Never used  Substance and Sexual Activity   Alcohol use: Yes    Comment: "glass of wine every couple of weeks"   Drug use: No   Sexual activity: Yes    Birth  control/protection: Post-menopausal  Other Topics Concern   Not on file  Social History Narrative   Not on file   Social Determinants of Health   Financial Resource Strain: Not on file  Food Insecurity: Not on file  Transportation Needs: Not on file  Physical Activity: Not on file  Stress: Not on file  Social Connections: Not on file  Intimate Partner Violence: Not on file    Outpatient Medications Prior to Visit  Medication Sig Dispense Refill   Calcium Carbonate 1500 (600 CA) MG TABS Take by mouth daily.     Cholecalciferol ([employer] VIT D 5000 HIGH-POTENCY PO) Take by mouth daily.     GLIPIZIDE XL 5 MG 24 hr tablet Take 5 mg by mouth daily.     glucose blood (PRECISION QID TEST) test strip Use 1 each (1 strip total) 2 (two) times daily     RYBELSUS 3 MG TABS Take 1 tablet by mouth daily.     No facility-administered medications prior to visit.  ROS:  Review of Systems BREAST: No symptoms   OBJECTIVE:   Vitals:  There were no vitals taken for this visit.  Physical Exam  Results: No results found for this or any previous visit (from the past 24 hour(s)).   Assessment/Plan: No diagnosis found.    No orders of the defined types were placed in this encounter.     No follow-ups on file.  Laurann Mcmorris B. Kele Barthelemy, PA-C 06/25/2022 4:56 PM

## 2022-06-26 ENCOUNTER — Encounter: Payer: Self-pay | Admitting: Obstetrics and Gynecology

## 2022-06-26 ENCOUNTER — Ambulatory Visit (INDEPENDENT_AMBULATORY_CARE_PROVIDER_SITE_OTHER): Payer: Medicare HMO | Admitting: Obstetrics and Gynecology

## 2022-06-26 VITALS — BP 124/80 | Ht 64.0 in | Wt 168.0 lb

## 2022-06-26 DIAGNOSIS — N611 Abscess of the breast and nipple: Secondary | ICD-10-CM

## 2022-06-26 MED ORDER — DOXYCYCLINE HYCLATE 100 MG PO CAPS
100.0000 mg | ORAL_CAPSULE | Freq: Two times a day (BID) | ORAL | 0 refills | Status: AC
Start: 2022-06-26 — End: 2022-07-06

## 2022-07-03 ENCOUNTER — Telehealth: Payer: Self-pay | Admitting: Obstetrics and Gynecology

## 2022-07-03 NOTE — Telephone Encounter (Signed)
Patient called and wanted to let you know the antibiotics are working really good. Also wanted to say Thank You

## 2022-07-03 NOTE — Telephone Encounter (Signed)
Good.

## 2022-07-06 ENCOUNTER — Other Ambulatory Visit: Payer: Self-pay | Admitting: Family Medicine

## 2022-07-06 DIAGNOSIS — Z1231 Encounter for screening mammogram for malignant neoplasm of breast: Secondary | ICD-10-CM

## 2022-07-18 ENCOUNTER — Telehealth: Payer: Self-pay

## 2022-07-18 NOTE — Telephone Encounter (Signed)
I contacted the patient via phone. I advised the patient were do accept [employer]. She would need to contact her plan to make sure Helmut Muster is on the plan she has with Community education officer. Patient is aware to contact her insurance provider.

## 2022-07-18 NOTE — Telephone Encounter (Signed)
TRIAGE VOICEMAIL: Patient inquiring if Althea Grimmer is in [employer]. She wants to be sure they will pay before she proceeds with any appointments.

## 2022-08-16 ENCOUNTER — Ambulatory Visit: Payer: Medicare HMO | Admitting: Obstetrics and Gynecology

## 2022-09-06 ENCOUNTER — Ambulatory Visit
Admission: RE | Admit: 2022-09-06 | Discharge: 2022-09-06 | Disposition: A | Discharge status: Home / Self Care | Payer: Medicare HMO | Source: Ambulatory Visit | Attending: Family Medicine | Admitting: Family Medicine

## 2022-09-06 DIAGNOSIS — Z1231 Encounter for screening mammogram for malignant neoplasm of breast: Secondary | ICD-10-CM | POA: Insufficient documentation

## 2022-09-19 NOTE — Progress Notes (Unsigned)
PCP: Jerl Mina, MD   No chief complaint on file.   HPI:      [name] is a 67 y.o. G2P2002 who LMP was No LMP recorded. Patient is postmenopausal., presents today for her annual examination.  Her menses are absent due to menopause.  She does not have PMB. She does have occas night sweats.   Sex activity: single partner, contraception - post menopausal status. She does have vaginal dryness. Not using lubricants.  Last Pap:   Results were:01/20/18 no abnormalities /neg HPV DNA.  Hx of STDs: none  Last mammogram: 09/06/22  Results were: normal--routine follow-up in 12 months. Has appt 7/23 There is a FH of breast cancer in her pat cousin and now sister, genetic testing not indicated for pt. There is no FH of ovarian cancer. The patient does self-breast exams.  Colonoscopy: colonoscopy 9/20 without abnormalities. Repeat due after 10 years.   Tobacco use: The patient denies current or previous tobacco use. Alcohol use: none  No drug use. Exercise: moderately active  She does get adequate calcium and Vitamin D in her diet.  Labs with PCP.   Past Medical History:  Diagnosis Date   Anginal pain (HCC)    stable   Arthritis    feet   Cough    AND CHEST CONGESTION, ON ANTIBIOTICS   Dysmenorrhea    Hallux rigidus of right foot    Hyperlipidemia    Hypertension    borderline   Palpitations     Past Surgical History:  Procedure Laterality Date   APPENDECTOMY     ARTHRODESIS METATARSALPHALANGEAL JOINT (MTPJ) Left    first mtp joint   CARDIAC CATHETERIZATION  06/05/12   [facility] - report in paper chart   CESAREAN SECTION  1985, 1989   COLONOSCOPY     COLONOSCOPY     FOOT ARTHRODESIS Right 07/11/2016   Procedure: ARTHRODESIS FOOT-1ST MTPJ FUSION-RIGHT;  Surgeon: Gwyneth Revels, DPM;  Location: [facility];  Service: Podiatry;  Laterality: Right;   NECK SURGERY Right    anterior   SQUAMOUS CELL CARCINOMA EXCISION Right    glossal   TONGUE SURGERY Right     glossal squamous cell carcinoma, smoker    Family History  Problem Relation Age of Onset   Hypertension Mother    Hypertension Father    Breast cancer Sister 73   Diabetes Sister 38       Type 1   Hypertension Sister    Diabetes Sister        Type 2   Diabetes Brother        Type 2    Social History   Socioeconomic History   Marital status: Married    Spouse name: Not on file   Number of children: Not on file   Years of education: Not on file   Highest education level: Not on file  Occupational History   Not on file  Tobacco Use   Smoking status: Never   Smokeless tobacco: Never  Vaping Use   Vaping status: Never Used  Substance and Sexual Activity   Alcohol use: Yes    Comment: "glass of wine every couple of weeks"   Drug use: No   Sexual activity: Yes    Birth control/protection: Post-menopausal  Other Topics Concern   Not on file  Social History Narrative   Not on file   Social Determinants of Health   Financial Resource Strain: Not on file  Food Insecurity: Not on  file  Transportation Needs: Not on file  Physical Activity: Not on file  Stress: Not on file  Social Connections: Not on file  Intimate Partner Violence: Not on file    No outpatient medications have been marked as taking for the 09/20/22 encounter (Appointment) with Marquist Binstock, Ilona Sorrel, PA-C.      ROS:  Review of Systems  Constitutional:  Negative for fatigue, fever and unexpected weight change.  Respiratory:  Negative for cough, shortness of breath and wheezing.   Cardiovascular:  Negative for chest pain, palpitations and leg swelling.  Gastrointestinal:  Negative for blood in stool, constipation, diarrhea, nausea and vomiting.  Endocrine: Negative for cold intolerance, heat intolerance and polyuria.  Genitourinary:  Negative for dyspareunia, dysuria, flank pain, frequency, genital sores, hematuria, menstrual problem, pelvic pain, urgency, vaginal bleeding, vaginal discharge and vaginal  pain.  Musculoskeletal:  Negative for back pain, joint swelling and myalgias.  Skin:  Negative for rash.  Neurological:  Negative for dizziness, syncope, light-headedness, numbness and headaches.  Hematological:  Negative for adenopathy.  Psychiatric/Behavioral:  Negative for agitation, confusion, sleep disturbance and suicidal ideas. The patient is not nervous/anxious.      Objective: There were no vitals taken for this visit.   Physical Exam Constitutional:      Appearance: She is well-developed.  Genitourinary:     Vulva normal.     Right Labia: No rash, tenderness or lesions.    Left Labia: No tenderness, lesions or rash.    No vaginal discharge, erythema or tenderness.     Mild vaginal atrophy present.     Right Adnexa: not tender and no mass present.    Left Adnexa: not tender and no mass present.    No cervical friability or polyp.     Uterus is not enlarged or tender.  Breasts:    Right: No mass, nipple discharge, skin change or tenderness.     Left: No mass, nipple discharge, skin change or tenderness.  Neck:     Thyroid: No thyromegaly.  Cardiovascular:     Rate and Rhythm: Normal rate and regular rhythm.     Heart sounds: Normal heart sounds. No murmur heard. Pulmonary:     Effort: Pulmonary effort is normal.     Breath sounds: Normal breath sounds.  Abdominal:     Palpations: Abdomen is soft.     Tenderness: There is no abdominal tenderness. There is no guarding or rebound.  Musculoskeletal:        General: Normal range of motion.     Cervical back: Normal range of motion.  Lymphadenopathy:     Cervical: No cervical adenopathy.  Neurological:     General: No focal deficit present.     Mental Status: She is alert and oriented to person, place, and time.     Cranial Nerves: No cranial nerve deficit.  Skin:    General: Skin is warm and dry.  Psychiatric:        Mood and Affect: Mood normal.        Behavior: Behavior normal.        Thought Content:  Thought content normal.        Judgment: Judgment normal.  Vitals reviewed.    Assessment/Plan: Encounter for annual routine gynecological examination  Encounter for screening mammogram for malignant neoplasm of breast; pt has mammo 7/23  Postmenopausal vaginal dryness--try coconut oil. F/u prn.   GYN counsel mammography screening, menopause, adequate intake of calcium and vitamin D, diet and exercise  F/U  No follow-ups on file.  Lavan Imes B. Aris Moman, PA-C 09/19/2022 7:58 PM

## 2022-09-20 ENCOUNTER — Ambulatory Visit: Payer: Medicare HMO | Admitting: Obstetrics and Gynecology

## 2022-09-20 ENCOUNTER — Encounter: Payer: Self-pay | Admitting: Obstetrics and Gynecology

## 2022-09-20 ENCOUNTER — Other Ambulatory Visit (HOSPITAL_COMMUNITY)
Admission: RE | Admit: 2022-09-20 | Discharge: 2022-09-20 | Disposition: A | Discharge status: Home / Self Care | Payer: Medicare HMO | Source: Ambulatory Visit | Attending: Obstetrics and Gynecology | Admitting: Obstetrics and Gynecology

## 2022-09-20 VITALS — BP 114/62 | Ht 64.0 in | Wt 164.0 lb

## 2022-09-20 DIAGNOSIS — Z01419 Encounter for gynecological examination (general) (routine) without abnormal findings: Secondary | ICD-10-CM | POA: Diagnosis not present

## 2022-09-20 DIAGNOSIS — Z1231 Encounter for screening mammogram for malignant neoplasm of breast: Secondary | ICD-10-CM

## 2022-09-20 DIAGNOSIS — Z124 Encounter for screening for malignant neoplasm of cervix: Secondary | ICD-10-CM

## 2022-09-20 NOTE — Patient Instructions (Signed)
I value your feedback and you entrusting us with your care. If you get a [facility] patient survey, I would appreciate you taking the time to let us know about your experience today. Thank you! ? ? ?

## 2022-09-24 LAB — CYTOLOGY - PAP: Diagnosis: NEGATIVE

## 2023-09-04 ENCOUNTER — Other Ambulatory Visit: Payer: Self-pay | Admitting: Family Medicine

## 2023-09-04 DIAGNOSIS — Z1231 Encounter for screening mammogram for malignant neoplasm of breast: Secondary | ICD-10-CM

## 2023-09-23 ENCOUNTER — Ambulatory Visit
Admission: RE | Admit: 2023-09-23 | Discharge: 2023-09-23 | Disposition: A | Discharge status: Home / Self Care | Source: Ambulatory Visit | Attending: Family Medicine | Admitting: Family Medicine

## 2023-09-23 DIAGNOSIS — Z1231 Encounter for screening mammogram for malignant neoplasm of breast: Secondary | ICD-10-CM | POA: Insufficient documentation

## 2023-10-08 ENCOUNTER — Ambulatory Visit: Admitting: Obstetrics and Gynecology

## 2023-11-05 ENCOUNTER — Ambulatory Visit: Admitting: Obstetrics and Gynecology

## 2023-12-02 ENCOUNTER — Ambulatory Visit: Admitting: Obstetrics and Gynecology

## 2023-12-16 NOTE — Progress Notes (Unsigned)
 PCP: Valora Lynwood FALCON, MD   No chief complaint on file.   HPI:      [name] is a 68 y.o. G2P2002 who LMP was No LMP recorded. Patient is postmenopausal., presents today for her annual examination.  Her menses are absent due to menopause.  She does not have PMB. She does have occas night sweats.   Sex activity: single partner, contraception - post menopausal status. She does have vaginal dryness. Not using lubricants.  Last Pap: 09/20/22  Results were: no abnormalities /neg HPV DNA 2019.  Hx of STDs: none  Last mammogram: 09/23/23 Results were: normal--routine follow-up in 12 months.  There is a FH of breast cancer in her pat cousin and now sister, genetic testing not indicated for pt. There is no FH of ovarian cancer. The patient does self-breast exams. Breast abscess from 5/24 resolved after abx.   Colonoscopy: colonoscopy 9/20 without abnormalities. Repeat due after 10 years.   Tobacco use: The patient denies current or previous tobacco use. Alcohol use: none  No drug use. Exercise: moderately active  She does get adequate calcium and Vitamin D in her diet.  Labs with PCP.   Past Medical History:  Diagnosis Date   Anginal pain    stable   Arthritis    feet   Cough    AND CHEST CONGESTION, ON ANTIBIOTICS   Dysmenorrhea    Hallux rigidus of right foot    Hyperlipidemia    Hypertension    borderline   Palpitations     Past Surgical History:  Procedure Laterality Date   APPENDECTOMY     ARTHRODESIS METATARSALPHALANGEAL JOINT (MTPJ) Left    first mtp joint   CARDIAC CATHETERIZATION  06/05/12   [facility] - report in paper chart   CESAREAN SECTION  1985, 1989   COLONOSCOPY     COLONOSCOPY     FOOT ARTHRODESIS Right 07/11/2016   Procedure: ARTHRODESIS FOOT-1ST MTPJ FUSION-RIGHT;  Surgeon: Ashley Soulier, DPM;  Location: [facility];  Service: Podiatry;  Laterality: Right;   NECK SURGERY Right    anterior   SQUAMOUS CELL CARCINOMA EXCISION Right     glossal   TONGUE SURGERY Right    glossal squamous cell carcinoma, smoker    Family History  Problem Relation Age of Onset   Hypertension Mother    Hypertension Father    Breast cancer Sister 37   Diabetes Sister 18       Type 1   Hypertension Sister    Diabetes Sister        Type 2   Diabetes Brother        Type 2    Social History   Socioeconomic History   Marital status: Married    Spouse name: Not on file   Number of children: Not on file   Years of education: Not on file   Highest education level: Not on file  Occupational History   Not on file  Tobacco Use   Smoking status: Never   Smokeless tobacco: Never  Vaping Use   Vaping status: Never Used  Substance and Sexual Activity   Alcohol use: Yes    Comment: glass of wine every couple of weeks   Drug use: No   Sexual activity: Yes    Birth control/protection: Post-menopausal  Other Topics Concern   Not on file  Social History Narrative   Not on file   Social Drivers of Health   Financial Resource Strain: Patient Declined (  11/05/2022)   Received from [facility]   Overall Financial Resource Strain (CARDIA)    Difficulty of Paying Living Expenses: Patient declined  Food Insecurity: Patient Declined (11/05/2022)   Received from [facility]   Hunger Vital Sign    Within the past 12 months, you worried that your food would run out before you got the money to buy more.: Patient declined    Within the past 12 months, the food you bought just didn't last and you didn't have money to get more.: Patient declined  Transportation Needs: Patient Declined (11/05/2022)   Received from [facility] - Transportation    In the past 12 months, has lack of transportation kept you from medical appointments or from getting medications?: Patient declined    Lack of Transportation (Non-Medical): Patient declined  Physical Activity: Not on file  Stress: Not on file   Social Connections: Not on file  Intimate Partner Violence: Not on file    No outpatient medications have been marked as taking for the 12/17/23 encounter (Appointment) with Joanne Salah, Bernarda NOVAK, PA-C.      ROS:  Review of Systems  Constitutional:  Negative for fatigue, fever and unexpected weight change.  Respiratory:  Negative for cough, shortness of breath and wheezing.   Cardiovascular:  Negative for chest pain, palpitations and leg swelling.  Gastrointestinal:  Negative for blood in stool, constipation, diarrhea, nausea and vomiting.  Endocrine: Negative for cold intolerance, heat intolerance and polyuria.  Genitourinary:  Negative for dyspareunia, dysuria, flank pain, frequency, genital sores, hematuria, menstrual problem, pelvic pain, urgency, vaginal bleeding, vaginal discharge and vaginal pain.  Musculoskeletal:  Negative for back pain, joint swelling and myalgias.  Skin:  Negative for rash.  Neurological:  Negative for dizziness, syncope, light-headedness, numbness and headaches.  Hematological:  Negative for adenopathy.  Psychiatric/Behavioral:  Negative for agitation, confusion, sleep disturbance and suicidal ideas. The patient is not nervous/anxious.      Objective: There were no vitals taken for this visit.   Physical Exam Constitutional:      Appearance: She is well-developed.  Genitourinary:     Vulva normal.     Right Labia: No rash, tenderness or lesions.    Left Labia: No tenderness, lesions or rash.    No vaginal discharge, erythema or tenderness.     Mild vaginal atrophy present.     Right Adnexa: not tender and no mass present.    Left Adnexa: not tender and no mass present.    No cervical friability or polyp.     Uterus is not enlarged or tender.  Breasts:    Right: No mass, nipple discharge, skin change or tenderness.     Left: No mass, nipple discharge, skin change or tenderness.  Neck:     Thyroid: No thyromegaly.  Cardiovascular:     Rate  and Rhythm: Normal rate and regular rhythm.     Heart sounds: Normal heart sounds. No murmur heard. Pulmonary:     Effort: Pulmonary effort is normal.     Breath sounds: Normal breath sounds.  Abdominal:     Palpations: Abdomen is soft.     Tenderness: There is no abdominal tenderness. There is no guarding or rebound.  Musculoskeletal:        General: Normal range of motion.     Cervical back: Normal range of motion.  Lymphadenopathy:     Cervical: No cervical adenopathy.  Neurological:     General:  No focal deficit present.     Mental Status: She is alert and oriented to person, place, and time.     Cranial Nerves: No cranial nerve deficit.  Skin:    General: Skin is warm and dry.  Psychiatric:        Mood and Affect: Mood normal.        Behavior: Behavior normal.        Thought Content: Thought content normal.        Judgment: Judgment normal.  Vitals reviewed.    Assessment/Plan: Encounter for annual routine gynecological examination  Cervical cancer screening - Plan: Cytology - PAP  Encounter for screening mammogram for malignant neoplasm of breast; pt current on mammo  GYN counsel mammography screening, menopause, adequate intake of calcium and vitamin D, diet and exercise    F/U  No follow-ups on file.  Aubert Choyce B. Tyrihanna Wingert, PA-C 12/16/2023 1:11 PM

## 2023-12-17 ENCOUNTER — Encounter: Payer: Self-pay | Admitting: Obstetrics and Gynecology

## 2023-12-17 ENCOUNTER — Ambulatory Visit (INDEPENDENT_AMBULATORY_CARE_PROVIDER_SITE_OTHER): Admitting: Obstetrics and Gynecology

## 2023-12-17 VITALS — BP 106/68 | HR 61 | Ht 64.0 in | Wt 178.0 lb

## 2023-12-17 DIAGNOSIS — Z1231 Encounter for screening mammogram for malignant neoplasm of breast: Secondary | ICD-10-CM

## 2023-12-17 DIAGNOSIS — L292 Pruritus vulvae: Secondary | ICD-10-CM

## 2023-12-17 DIAGNOSIS — Z01411 Encounter for gynecological examination (general) (routine) with abnormal findings: Secondary | ICD-10-CM

## 2023-12-17 DIAGNOSIS — L2989 Other pruritus: Secondary | ICD-10-CM | POA: Diagnosis not present

## 2023-12-17 DIAGNOSIS — Z803 Family history of malignant neoplasm of breast: Secondary | ICD-10-CM | POA: Diagnosis not present

## 2023-12-17 DIAGNOSIS — Z01419 Encounter for gynecological examination (general) (routine) without abnormal findings: Secondary | ICD-10-CM

## 2023-12-17 DIAGNOSIS — Z13820 Encounter for screening for osteoporosis: Secondary | ICD-10-CM

## 2023-12-17 NOTE — Patient Instructions (Signed)
 I value your feedback and you entrusting [place] with your care. If you get a [facility] patient survey, I would appreciate you taking the time to let us know about your experience today. Thank you! ? ? ?

## 2024-01-24 ENCOUNTER — Ambulatory Visit
Admission: RE | Admit: 2024-01-24 | Discharge: 2024-01-24 | Disposition: A | Discharge status: Home / Self Care | Source: Ambulatory Visit | Attending: Obstetrics and Gynecology | Admitting: Obstetrics and Gynecology

## 2024-01-24 DIAGNOSIS — M8589 Other specified disorders of bone density and structure, multiple sites: Secondary | ICD-10-CM | POA: Diagnosis not present

## 2024-01-24 DIAGNOSIS — Z13820 Encounter for screening for osteoporosis: Secondary | ICD-10-CM | POA: Insufficient documentation

## 2024-01-27 ENCOUNTER — Ambulatory Visit: Payer: Self-pay | Admitting: Obstetrics and Gynecology

## 2024-01-27 ENCOUNTER — Other Ambulatory Visit
